# Patient Record
Sex: Male | Born: 1945 | Race: White | Hispanic: No | Marital: Married | Smoking: Former smoker
Health system: Southern US, Community
[De-identification: ages and names within clinical notes are randomized; demographics above are authoritative.]

## PROBLEM LIST (undated history)

## (undated) DIAGNOSIS — C4490 Unspecified malignant neoplasm of skin, unspecified: Secondary | ICD-10-CM

## (undated) DIAGNOSIS — D126 Benign neoplasm of colon, unspecified: Secondary | ICD-10-CM

## (undated) DIAGNOSIS — M1990 Unspecified osteoarthritis, unspecified site: Secondary | ICD-10-CM

## (undated) DIAGNOSIS — N400 Enlarged prostate without lower urinary tract symptoms: Secondary | ICD-10-CM

## (undated) DIAGNOSIS — E785 Hyperlipidemia, unspecified: Secondary | ICD-10-CM

## (undated) DIAGNOSIS — I4891 Unspecified atrial fibrillation: Secondary | ICD-10-CM

## (undated) DIAGNOSIS — I2510 Atherosclerotic heart disease of native coronary artery without angina pectoris: Secondary | ICD-10-CM

## (undated) DIAGNOSIS — N289 Disorder of kidney and ureter, unspecified: Secondary | ICD-10-CM

## (undated) DIAGNOSIS — N529 Male erectile dysfunction, unspecified: Secondary | ICD-10-CM

## (undated) DIAGNOSIS — K5790 Diverticulosis of intestine, part unspecified, without perforation or abscess without bleeding: Secondary | ICD-10-CM

## (undated) DIAGNOSIS — I499 Cardiac arrhythmia, unspecified: Secondary | ICD-10-CM

## (undated) DIAGNOSIS — G4733 Obstructive sleep apnea (adult) (pediatric): Secondary | ICD-10-CM

## (undated) DIAGNOSIS — K219 Gastro-esophageal reflux disease without esophagitis: Secondary | ICD-10-CM

## (undated) DIAGNOSIS — M5126 Other intervertebral disc displacement, lumbar region: Secondary | ICD-10-CM

## (undated) DIAGNOSIS — R202 Paresthesia of skin: Secondary | ICD-10-CM

## (undated) DIAGNOSIS — E039 Hypothyroidism, unspecified: Secondary | ICD-10-CM

## (undated) DIAGNOSIS — R310 Gross hematuria: Secondary | ICD-10-CM

## (undated) DIAGNOSIS — I10 Essential (primary) hypertension: Secondary | ICD-10-CM

## (undated) DIAGNOSIS — G4730 Sleep apnea, unspecified: Secondary | ICD-10-CM

## (undated) DIAGNOSIS — E079 Disorder of thyroid, unspecified: Secondary | ICD-10-CM

## (undated) DIAGNOSIS — Z87442 Personal history of urinary calculi: Secondary | ICD-10-CM

## (undated) DIAGNOSIS — G629 Polyneuropathy, unspecified: Secondary | ICD-10-CM

## (undated) DIAGNOSIS — N1330 Unspecified hydronephrosis: Secondary | ICD-10-CM

## (undated) DIAGNOSIS — E663 Overweight: Secondary | ICD-10-CM

## (undated) DIAGNOSIS — N200 Calculus of kidney: Secondary | ICD-10-CM

## (undated) DIAGNOSIS — E119 Type 2 diabetes mellitus without complications: Secondary | ICD-10-CM

## (undated) HISTORY — PX: NASAL SINUS SURGERY: SHX719

## (undated) HISTORY — DX: Gastro-esophageal reflux disease without esophagitis: K21.9

## (undated) HISTORY — PX: TONSILLECTOMY: SUR1361

## (undated) HISTORY — DX: Male erectile dysfunction, unspecified: N52.9

## (undated) HISTORY — PX: CARDIAC CATHETERIZATION: SHX172

## (undated) HISTORY — DX: Obstructive sleep apnea (adult) (pediatric): G47.33

## (undated) HISTORY — DX: Disorder of kidney and ureter, unspecified: N28.9

## (undated) HISTORY — DX: Hyperlipidemia, unspecified: E78.5

## (undated) HISTORY — PX: OTHER SURGICAL HISTORY: SHX169

## (undated) HISTORY — DX: Paresthesia of skin: R20.2

## (undated) HISTORY — DX: Unspecified atrial fibrillation: I48.91

## (undated) HISTORY — DX: Overweight: E66.3

## (undated) HISTORY — DX: Atherosclerotic heart disease of native coronary artery without angina pectoris: I25.10

## (undated) HISTORY — DX: Unspecified malignant neoplasm of skin, unspecified: C44.90

## (undated) HISTORY — DX: Calculus of kidney: N20.0

## (undated) HISTORY — DX: Unspecified hydronephrosis: N13.30

## (undated) HISTORY — DX: Disorder of thyroid, unspecified: E07.9

## (undated) HISTORY — DX: Essential (primary) hypertension: I10

## (undated) HISTORY — PX: EYE SURGERY: SHX253

## (undated) HISTORY — DX: Benign prostatic hyperplasia without lower urinary tract symptoms: N40.0

## (undated) HISTORY — PX: HERNIA REPAIR: SHX51

## (undated) HISTORY — PX: CATARACT EXTRACTION: SUR2

## (undated) HISTORY — DX: Type 2 diabetes mellitus without complications: E11.9

## (undated) HISTORY — DX: Sleep apnea, unspecified: G47.30

## (undated) HISTORY — DX: Polyneuropathy, unspecified: G62.9

## (undated) HISTORY — DX: Gross hematuria: R31.0

---

## 2003-07-02 ENCOUNTER — Other Ambulatory Visit: Payer: Self-pay

## 2005-06-10 ENCOUNTER — Ambulatory Visit: Payer: Self-pay | Admitting: Internal Medicine

## 2008-01-05 ENCOUNTER — Ambulatory Visit: Payer: Self-pay | Admitting: Internal Medicine

## 2008-01-12 ENCOUNTER — Ambulatory Visit: Payer: Self-pay | Admitting: Gastroenterology

## 2008-07-16 ENCOUNTER — Emergency Department: Payer: Self-pay | Admitting: Emergency Medicine

## 2008-07-18 ENCOUNTER — Ambulatory Visit: Payer: Self-pay | Admitting: Urology

## 2008-07-22 ENCOUNTER — Ambulatory Visit: Payer: Self-pay | Admitting: Urology

## 2008-07-29 ENCOUNTER — Ambulatory Visit: Payer: Self-pay | Admitting: Urology

## 2008-09-02 ENCOUNTER — Ambulatory Visit: Payer: Self-pay | Admitting: Urology

## 2008-09-16 ENCOUNTER — Ambulatory Visit: Payer: Self-pay | Admitting: Urology

## 2009-10-07 ENCOUNTER — Emergency Department: Payer: Self-pay | Admitting: Emergency Medicine

## 2009-10-31 ENCOUNTER — Ambulatory Visit: Payer: Self-pay | Admitting: Urology

## 2009-11-07 ENCOUNTER — Ambulatory Visit: Payer: Self-pay | Admitting: Urology

## 2009-11-17 ENCOUNTER — Ambulatory Visit: Payer: Self-pay | Admitting: Urology

## 2010-02-18 ENCOUNTER — Ambulatory Visit: Payer: Self-pay | Admitting: Internal Medicine

## 2010-08-18 ENCOUNTER — Inpatient Hospital Stay: Payer: Self-pay | Admitting: Internal Medicine

## 2011-04-19 ENCOUNTER — Ambulatory Visit: Payer: Self-pay | Admitting: Surgery

## 2011-06-27 ENCOUNTER — Ambulatory Visit: Payer: Self-pay | Admitting: Internal Medicine

## 2012-02-08 ENCOUNTER — Ambulatory Visit: Payer: Self-pay | Admitting: Internal Medicine

## 2012-02-08 LAB — BASIC METABOLIC PANEL
EGFR (African American): >60
EGFR (Non-African Amer.): >60
Osmolality: 288 (ref 275–301)
Potassium: 3.8 mmol/L (ref 3.5–5.1)
Sodium: 143 mmol/L (ref 136–145)

## 2012-02-08 LAB — URINALYSIS, COMPLETE
Bacteria: NEGATIVE
Bilirubin,UR: NEGATIVE
Glucose,UR: 100 mg/dL (ref 0–75)
Nitrite: NEGATIVE
Ph: 6 (ref 4.5–8.0)
Protein: NEGATIVE
Squamous Epithelial: NONE SEEN

## 2012-02-09 ENCOUNTER — Ambulatory Visit: Payer: Self-pay | Admitting: Internal Medicine

## 2012-02-09 LAB — URINE CULTURE

## 2012-05-14 ENCOUNTER — Observation Stay: Payer: Self-pay | Admitting: Internal Medicine

## 2012-05-14 LAB — CBC WITH DIFFERENTIAL/PLATELET
Basophil %: 1.6 %
Eosinophil #: 0.1 10*3/uL (ref 0.0–0.7)
HGB: 15.8 g/dL (ref 13.0–18.0)
Lymphocyte #: 1.8 10*3/uL (ref 1.0–3.6)
MCH: 30.8 pg (ref 26.0–34.0)
MCHC: 33.5 g/dL (ref 32.0–36.0)
MCV: 92 fL (ref 80–100)
Monocyte #: 0.4 x10 3/mm (ref 0.2–1.0)
Neutrophil #: 4.7 10*3/uL (ref 1.4–6.5)
Neutrophil %: 65.5 %
RBC: 5.13 10*6/uL (ref 4.40–5.90)

## 2012-05-14 LAB — COMPREHENSIVE METABOLIC PANEL
Alkaline Phosphatase: 78 U/L (ref 50–136)
Bilirubin,Total: 1.4 mg/dL — ABNORMAL HIGH (ref 0.2–1.0)
Calcium, Total: 8.9 mg/dL (ref 8.5–10.1)
Chloride: 108 mmol/L — ABNORMAL HIGH (ref 98–107)
Co2: 24 mmol/L (ref 21–32)
EGFR (African American): >60
EGFR (Non-African Amer.): >60
Glucose: 211 mg/dL — ABNORMAL HIGH (ref 65–99)
Osmolality: 286 (ref 275–301)
SGPT (ALT): 23 U/L (ref 12–78)

## 2012-05-14 LAB — TROPONIN I: Troponin-I: <0.02 ng/mL

## 2012-05-14 LAB — URINALYSIS, COMPLETE
Blood: NEGATIVE
Hyaline Cast: 3
Nitrite: NEGATIVE
Ph: 6 (ref 4.5–8.0)
Protein: NEGATIVE
RBC,UR: 1 /HPF (ref 0–5)
Specific Gravity: 1.017 (ref 1.003–1.030)
WBC UR: 1 /HPF (ref 0–5)

## 2012-05-15 LAB — BASIC METABOLIC PANEL
Calcium, Total: 8.7 mg/dL (ref 8.5–10.1)
Chloride: 108 mmol/L — ABNORMAL HIGH (ref 98–107)
Co2: 27 mmol/L (ref 21–32)
Creatinine: 1.03 mg/dL (ref 0.60–1.30)
EGFR (Non-African Amer.): >60
Potassium: 3.2 mmol/L — ABNORMAL LOW (ref 3.5–5.1)
Sodium: 143 mmol/L (ref 136–145)

## 2012-05-15 LAB — TROPONIN I: Troponin-I: <0.02 ng/mL

## 2012-05-15 LAB — LIPID PANEL
HDL Cholesterol: 42 mg/dL (ref 40–60)
Ldl Cholesterol, Calc: 54 mg/dL (ref 0–100)
Triglycerides: 117 mg/dL (ref 0–200)

## 2012-05-15 LAB — HEMOGLOBIN A1C: Hemoglobin A1C: 6.9 % — ABNORMAL HIGH (ref 4.2–6.3)

## 2012-10-24 ENCOUNTER — Ambulatory Visit: Payer: Self-pay | Admitting: Otolaryngology

## 2012-11-01 DIAGNOSIS — C4491 Basal cell carcinoma of skin, unspecified: Secondary | ICD-10-CM

## 2012-11-01 HISTORY — DX: Basal cell carcinoma of skin, unspecified: C44.91

## 2012-11-02 ENCOUNTER — Ambulatory Visit: Payer: Self-pay | Admitting: Otolaryngology

## 2012-12-12 ENCOUNTER — Ambulatory Visit: Payer: Self-pay | Admitting: Internal Medicine

## 2013-01-02 ENCOUNTER — Ambulatory Visit: Payer: Self-pay | Admitting: Internal Medicine

## 2013-04-11 ENCOUNTER — Ambulatory Visit: Payer: Self-pay | Admitting: Gastroenterology

## 2013-10-10 DIAGNOSIS — E039 Hypothyroidism, unspecified: Secondary | ICD-10-CM | POA: Insufficient documentation

## 2014-01-10 DIAGNOSIS — G4733 Obstructive sleep apnea (adult) (pediatric): Secondary | ICD-10-CM | POA: Insufficient documentation

## 2014-04-08 ENCOUNTER — Ambulatory Visit: Payer: Self-pay | Admitting: Urology

## 2014-05-16 DIAGNOSIS — I480 Paroxysmal atrial fibrillation: Secondary | ICD-10-CM | POA: Insufficient documentation

## 2014-06-27 ENCOUNTER — Ambulatory Visit: Payer: Self-pay | Admitting: Urology

## 2014-06-30 LAB — COMPREHENSIVE METABOLIC PANEL
ANION GAP: 8 (ref 7–16)
Albumin: 3.9 g/dL (ref 3.4–5.0)
Alkaline Phosphatase: 71 U/L (ref 46–116)
BUN: 16 mg/dL (ref 7–18)
Bilirubin,Total: 1.5 mg/dL — ABNORMAL HIGH (ref 0.2–1.0)
Calcium, Total: 9.2 mg/dL (ref 8.5–10.1)
Chloride: 104 mmol/L (ref 98–107)
Co2: 26 mmol/L (ref 21–32)
Creatinine: 1.23 mg/dL (ref 0.60–1.30)
EGFR (African American): >60
EGFR (Non-African Amer.): >60
Glucose: 171 mg/dL — ABNORMAL HIGH (ref 65–99)
Osmolality: 281 (ref 275–301)
POTASSIUM: 3.1 mmol/L — AB (ref 3.5–5.1)
SGOT(AST): 21 U/L (ref 15–37)
SGPT (ALT): 19 U/L (ref 14–63)
SODIUM: 138 mmol/L (ref 136–145)
TOTAL PROTEIN: 7.9 g/dL (ref 6.4–8.2)

## 2014-06-30 LAB — CBC
HCT: 41.2 % (ref 40.0–52.0)
HGB: 13.6 g/dL (ref 13.0–18.0)
MCH: 29.6 pg (ref 26.0–34.0)
MCHC: 33.1 g/dL (ref 32.0–36.0)
MCV: 89 fL (ref 80–100)
PLATELETS: 216 10*3/uL (ref 150–440)
RBC: 4.6 10*6/uL (ref 4.40–5.90)
RDW: 14.3 % (ref 11.5–14.5)
WBC: 13.3 10*3/uL — ABNORMAL HIGH (ref 3.8–10.6)

## 2014-06-30 LAB — URINALYSIS, COMPLETE
BACTERIA: NONE SEEN
SQUAMOUS EPITHELIAL: NONE SEEN
Specific Gravity: 1.016 (ref 1.003–1.030)
WBC UR: 19 /HPF (ref 0–5)

## 2014-06-30 LAB — MAGNESIUM: Magnesium: 1.5 mg/dL — ABNORMAL LOW

## 2014-07-01 ENCOUNTER — Inpatient Hospital Stay: Payer: Self-pay | Admitting: Urology

## 2014-07-01 LAB — CBC WITH DIFFERENTIAL/PLATELET
Basophil #: 0.1 10*3/uL (ref 0.0–0.1)
Basophil %: 0.5 %
Eosinophil #: 0 10*3/uL (ref 0.0–0.7)
Eosinophil %: 0 %
HCT: 37.2 % — AB (ref 40.0–52.0)
HGB: 12.3 g/dL — ABNORMAL LOW (ref 13.0–18.0)
LYMPHS ABS: 1.6 10*3/uL (ref 1.0–3.6)
Lymphocyte %: 15.8 %
MCH: 29.6 pg (ref 26.0–34.0)
MCHC: 32.9 g/dL (ref 32.0–36.0)
MCV: 90 fL (ref 80–100)
MONO ABS: 0.8 x10 3/mm (ref 0.2–1.0)
MONOS PCT: 8.1 %
NEUTROS ABS: 7.6 10*3/uL — AB (ref 1.4–6.5)
Neutrophil %: 75.6 %
PLATELETS: 196 10*3/uL (ref 150–440)
RBC: 4.15 10*6/uL — ABNORMAL LOW (ref 4.40–5.90)
RDW: 14.5 % (ref 11.5–14.5)
WBC: 10.1 10*3/uL (ref 3.8–10.6)

## 2014-07-01 LAB — BASIC METABOLIC PANEL
Anion Gap: 6 — ABNORMAL LOW (ref 7–16)
BUN: 13 mg/dL (ref 7–18)
CALCIUM: 8.1 mg/dL — AB (ref 8.5–10.1)
CO2: 28 mmol/L (ref 21–32)
Chloride: 107 mmol/L (ref 98–107)
Creatinine: 1.04 mg/dL (ref 0.60–1.30)
EGFR (Non-African Amer.): >60
Glucose: 169 mg/dL — ABNORMAL HIGH (ref 65–99)
Osmolality: 285 (ref 275–301)
Potassium: 3.6 mmol/L (ref 3.5–5.1)
Sodium: 141 mmol/L (ref 136–145)

## 2014-07-02 LAB — URINE CULTURE

## 2014-07-05 ENCOUNTER — Ambulatory Visit: Payer: Self-pay | Admitting: Urology

## 2014-07-09 ENCOUNTER — Ambulatory Visit: Payer: Self-pay | Admitting: Urology

## 2014-07-10 ENCOUNTER — Ambulatory Visit: Payer: Self-pay | Admitting: Urology

## 2014-08-01 ENCOUNTER — Ambulatory Visit: Payer: Self-pay | Admitting: Urology

## 2014-08-21 ENCOUNTER — Ambulatory Visit: Payer: Self-pay | Admitting: Urology

## 2014-08-30 ENCOUNTER — Ambulatory Visit: Payer: Self-pay

## 2014-09-17 NOTE — Consult Note (Signed)
Brief Consult Note: Diagnosis: CP, atypical, neg trop, normal ECG, known insig CAD by cath.   Patient was seen by consultant.   Consult note dictated.   Comments: REC  Agree with current therapy, defer full dose anticoagulation, defer further cardiac diagnostics, if patient dose well overnight clinically may consider dc in am.  Electronic Signatures: Isaias Cowman (MD)  (Signed 15-Dec-13 15:52)  Authored: Brief Consult Note   Last Updated: 15-Dec-13 15:52 by Isaias Cowman (MD)

## 2014-09-17 NOTE — H&P (Signed)
PATIENT NAME:  Christopher [name], Christopher [name] MR#:  [id number] DATE OF BIRTH:  [birth year]  DATE OF ADMISSION:  05/14/2012  PRIMARY CARE PHYSICIAN: Dr. Felipa Furnace of [facility].  PRIMAREY CARDIOLOGIST: Dr. Nehemiah Massed.   CHIEF COMPLAINT: Chest tightness and discomfort.   HISTORY OF PRESENT ILLNESS: This is a 69 year old male with past medical history of hypertension, diabetes, coronary artery disease as diagnosed by cardiac cath 2 years ago with blockages in multiple arteries and advised him to go for medical management in regular follow-up, which he was following. Tuesday morning 5:30 a.m. he woke up with some discomfort in the chest, which is substernal and discomfort type, which was not going away. He checked his blood pressure, which was slightly on the lower side; he mentions 99/60s. His heart rate was ranging from 60 to 90 and his blood sugar was more than 100. He continued having this discomfort and around 9:00 a.m. while he tried to get his breakfast, he suddenly had excessive sweating and his wife suggested to him to go to the Emergency Room rather than waiting, so he decided to come over here. ER physician evaluated him. He has a strong cardiac history, though he has troponin and EKG negative, but because of cardiac history and blockages, he is likely to have coronary artery disease at this time and that is why they suggested to admit him for observation.  REVIEW OF SYSTEMS:  CONSTITUTIONAL: He denies any fever, fatigue, weight loss, or any similar chest pain in the past and he says that he was able to walk or work without any chest pain or discomfort until now.   HEAD AND NECK: Atraumatic. Denies any headache, hearing problems or discharge from the ears. EYES: Denies any burning or discharge from the eyes or any redness.  RESPIRATORY: Denies any shortness of breath.  CARDIOVASCULAR: Denies any palpitation. Chest pain as described above. Denies any edema or swelling of the legs or any syncopal  episode.  GASTROINTESTINAL: Denies any nausea, vomiting, diarrhea or abdominal pain.  GENITOURINARY: Denies any increased frequency of urination or burning in the urination.  MUSCULOSKELETAL: Denies any joint swelling, tenderness or pain.  NEUROLOGICAL: Denies any weakness, numbness, tingling or tremors.  PSYCHIATRIC: Denies any insomnia, depression or anxiety episodes.   PAST MEDICAL HISTORY: Positive for coronary artery disease, hypertension, diabetes, benign prostatic hypertrophy and hyperlipidemia.   PAST SURGICAL HISTORY: Positive for hernia surgery and tonsillectomy.   MEDICATIONS AT HOME: Levothyroxine 100 mcg daily, amlodipine and benazepril 5/10 mg daily, finasteride 5 mg daily, metformin 1000 mg 2 times a day, glipizide 10 mg daily, pantoprazole twice a day, tamsulosin 0.4 mg tablet, 2 tablets at bedtime, atorvastatin 20 mg at bedtime, doxazosin 4 mg at bedtime, Januvia 100 mg daily, bisoprolol and hydrochlorothiazide 5 mg plus 6.25 mg daily and aspirin 82 mg daily.   SOCIAL HISTORY: No smoking, occasional alcohol and no IV drug use. He is a retired Teaching laboratory technician person.   FAMILY HISTORY: Father with congestive heart failure and liver cancer. Mother had stroke and heart failure.   PHYSICAL EXAMINATION:  VITAL SIGNS: Temperature 97.9, pulse rate 71, respirations 16, blood pressure 122/75, oxygen saturation 98 on room air.  GENERAL: He is fully alert, oriented to time, place, and person. Not in any acute distress. He is cooperative with history taking and physical examination.  HEAD AND NECK: Atraumatic. Conjunctivae pink. Oral mucosa moist. Hearing grossly intact.  NECK: Supple. No mass. No JVD.  RESPIRATORY: Bilateral clear and equal air entry. No crackles or  rhonchi heard.  CARDIOVASCULAR: S1, S2 present, regular. No murmur appreciated. No local tenderness on chest.  ABDOMEN: Soft, nontender. Bowel sounds present. No organomegaly appreciated.  SKIN: No rashes.  EXTREMITIES: No edema  on the legs. Joints: Nontender. No swelling.  NEUROLOGICAL: Grossly intact. Power 5 out of 5 in all 4 limbs. Sensation grossly normal. No tremors. No rigidity.  PSYCHIATRIC: Does not appear in any acute psychiatric problem at this point of time.   LABORATORIES: Glucose 211, BUN 15, creatinine 0.91, sodium 140, potassium 3.5, chloride 108, CO2 24. Calcium 8.9, total protein 7.3, albumin 4.0, bilirubin 1.4, alkaline phosphatase 78, SGOT 18,  SGPT 23. Troponin less than 0.02. WBC 7.2, hemoglobin 15.8, platelets 203, MCV 92, APTT 28.1. Urinalysis grossly negative. EKG normal sinus rhythm. Chest x-ray, no acute cardiopulmonary disease evident.   ASSESSMENT AND PLAN: A 69 year old male with a history of blockages in the coronary artery as evident by cardiac catheterization in the past and was advised to go for medical management. He came with chest tightness. 1.  Chest pain, rule out coronary artery disease: Will keep him under observation in telemetry, do  his troponin every 8 hours for 3 times, continue all cardiac medications as he is taking at home and will do stress test in the morning. He is following with Dr. Nehemiah Massed in cardiology, so called Dr. ______.  I spoke to him for cardiology consult and if any further workup needed.  2.  Hypertension: We will continue his home medication of amlodipine, benazepril, bisoprolol and hydrochlorothiazide.  3.  Diabetes: We will continue his home medication of metformin, glipizide and Januvia.  4.  Hyperlipidemia: Will continue his home medication of atorvastatin.  5.  Benign prostatic hypertrophy: We will continue his home medication doxazosin and tamsulosin.  6.  Hypothyroidism: We will continue home medication of levothyroxine 100 mcg.  7.  History of gastrointestinal bleed and for gastrointestinal prophylaxis: Will continue pantoprazole twice a day.  8.  CODE STATUS: Full code.   TOTAL TIME SPENT: 50 minutes in the admission.   I will endorse to  _[email or web address]____ Clinic on-call doctor in the afternoon.   ____________________________ Ceasar Lund Anselm Jungling, MD vgv:aw D: 05/14/2012 12:29:00 ET T: 05/15/2012 06:06:34 ET JOB#: [id number]  cc: Ceasar Lund. Anselm Jungling, MD, <Dictator> Vaughan Basta MD ELECTRONICALLY SIGNED 05/15/2012 23:01

## 2014-09-20 NOTE — Op Note (Signed)
PATIENT NAME:  Christopher [name], Christopher [name] MR#:  [id number] DATE OF BIRTH:  April 02, 1946  DATE OF OPERATION:  11/02/2012  SURGEON:  Janalee Dane, MD.  PREOPERATIVE DIAGNOSIS: Nasal obstruction, secondary to septal deformity and bilateral inferior turbinate hypertrophy.   POSTOPERATIVE DIAGNOSIS: Nasal obstruction, secondary to septal deformity and bilateral inferior turbinate hypertrophy.   PROCEDURES: 1.  (Revision) septoplasty.  2.  Bilateral submucous resection of the inferior turbinate.   ANESTHESIA:  General  FINDINGS:  The septal mucoperichondrium and mucoperiosteum were very scarred together, consistent with the prior septoplasty. The mucosa was very thin at the junction between the residual, severely right-sided vomerine bone and residual perpendicular plate of the ethmoid. There was a large maxillary crest deviation to the right. The inferior turbinates were severely hypertrophied.  DESCRIPTION OF PROCEDURE:  The patient was identified in the holding area and was brought back to the operating room in the supine position on the operating room table.  After general endotracheal anesthesia had been induced the patient was turned 90 degrees counter clockwise from anesthesia.  The nose was anesthetized with infraorbital nerve blocks and septal injection with 0.5% Lidocaine and 0.25% Bupivacaine mixed with 1:150,000 with Epinephrine and phenylephrine Lidocaine soaked pledgets, two on each side were placed and the face was prepped and draped in the usual fashion.  The pledgets were removed.  A 15 blade was used to make a left-sided hemitransfixion incision and septal mucoperichondrial mucoperiosteal leaflets elevated.  There was a large inferior spur that was resected with Jansen-Middleton forceps.  The remaining septum was deviated back and forth in an accordion like fashion.  The bony cartilaginous junction was then divided and a moderate amount of vomer and perpendicular plate was taken down with  Jansen-Middleton forceps, releasing the tension on the remaining septum.  The septum then swung back into the midline.  The septal leaflets were closed with quilting 4-0 chromic suture.  The left sided hemitransfixion incision was closed with 4-0 plain gut.  Attention was directed to the turbinates which had been previously injected on the left.  The head of the inferior turbinate on the left was incised with a 15 blade and the medial mucoperiosteum was elevated using a caudal elevator.  Once this had been elevated Knight scissors were used to resect the conchal bone and lateral mucoperiosteum.  The inferior margin of the remaining mucoperiosteum was then cauterized with suction cautery and Surgiflo was placed at the inferior to the inferior margin of the remaining inferior turbinate.  An identical procedure was performed on the right inferior turbinate with once again placement of Surgiflo along its inferior margin.  Temporary Telfa pledgets were then placed.  The patient was allowed to emerge from anesthesia, extubated in the operating room and taken to the recovery room in stable condition.  There were no complications.  Estimated blood loss was less than 10 milliliters.  FINDINGS: The septal mucoperichondrium been repaired mucoperiosteum was very scarred together, consistent with the prior septoplasty. The mucosa was very and at the junction between residual severely right-sided vomer ring bone residual perpendicular plate of the ethmoid. There was a large maxillary crest deviation to the right the inferior turbinates were severely hypertrophied total amount of Surgiflo was 1 unit.   There were no complications.    ____________________________ Lenna Sciara. Nadeen Landau, MD jmc:dm D: 11/02/2012 11:11:28 ET T: 11/02/2012 11:18:06 ET JOB#: [id number]  cc: Janalee Dane, MD, <Dictator> Nicholos Johns MD ELECTRONICALLY SIGNED 11/22/2012 7:47

## 2014-09-20 NOTE — Consult Note (Signed)
PATIENT NAME:  Christopher [name], Christopher [name] MR#:  [id number] DATE OF BIRTH:  01-Jul-1945  DATE OF CONSULTATION:  05/14/2012  PRIMARY CARE PHYSICIAN: Dr. Doy Hutching.  REFERRING PHYSICIAN:   CONSULTING PHYSICIAN:  Isaias Cowman, MD  CHIEF COMPLAINT: " My wife made me come.".   REASON FOR CONSULTATION: Consultation requested for evaluation of chest discomfort.   HISTORY OF PRESENT ILLNESS: The patient is a 69 year old gentleman with known insignificant coronary artery disease, hypertension and diabetes. He reports that he was in his usual state of health until last evening when he noted palpitations. The patient has had a history of palpitations. He denies chest pain or shortness of breath. He presented to [facility] Emergency Room where EKG was nondiagnostic. Troponin is negative. The patient denies chest pain.   PAST MEDICAL HISTORY:  1.  Hypertension.  2.  Diabetes. 3.  Palpitations.  4.  Hypothyroidism.   MEDICATIONS ON ADMISSION: Aspirin 81 mg daily, amlodipine/benazepril 5/10, 1 daily, atorvastatin 20 mg daily, doxazosin 4 mg daily, bisoprolol/hydrochlorothiazide 5/6.25 mg daily, finasteride 5 mg daily, Flomax 0.4 mg at bedtime, glipizide 20 mg daily, Januvia 100 mg daily, levothyroxine 100 mcg daily, metformin 1000 mg b.i.d. and pantoprazole 40 mg b.i.d.   SOCIAL HISTORY: He is married. He resides with his wife. He denies tobacco abuse. He occasionally drinks alcohol.   FAMILY HISTORY: No immediate family history of coronary disease or myocardial infarction.   REVIEW OF SYSTEMS:   CONSTITUTIONAL: No fever or chills.  EYES: No blurry vision.  EARS: No hearing loss.  RESPIRATORY: No shortness of breath.  CARDIOVASCULAR: Palpitations as described above.  GASTROINTESTINAL: No nausea, vomiting, or diarrhea.  GASTROINTESTINAL: No dysuria or hematuria.  ENDOCRINE: No polyuria or polydipsia.  MUSCULOSKELETAL: No arthralgias or myalgias.  NEUROLOGICAL: No focal weakness or numbness.  PSYCHOLOGICAL: No  depression or anxiety.   PHYSICAL EXAMINATION:  VITAL SIGNS: Blood pressure 129/81, pulse 70, respirations 20, temperature 98, pulse ox 95%.  HEENT: Pupils equal, reactive to light and accommodation.  NECK: Supple without thyromegaly.  LUNGS: Clear.  HEART: Normal JVP. Normal PMI. Regular rate and rhythm. Normal S1, S2. No appreciable gallop, murmur, or rub.  ABDOMEN: Soft and nontender. Pulses were intact bilaterally.  MUSCULOSKELETAL: Normal muscle tone.  NEUROLOGIC: The patient is alert and oriented x3. Motor and sensory both grossly intact.   ACCESSORY DATA: EKG reveals sinus rhythm. Normal ECG.   IMPRESSION: A 69 year old gentleman with known insignificant coronary artery disease, who presents with chest pain with atypical features, currently without chest pain. EKG is normal. Troponin is negative.   RECOMMENDATIONS:  1.  I agree with current therapy.  2.  Would defer full dose anticoagulation.  3.  In light of absence of chest pain, normal ECG, normal troponin and recent insignificant coronary artery disease by cardiac catheterization, would not recommend noninvasive or invasive cardiac evaluation at this time.  4.  Continue to monitor on telemetry. If the patient does well clinically, may consider discharge home in a.m.    ____________________________ Isaias Cowman, MD ap:aw D: 05/14/2012 15:51:21 ET T: 05/15/2012 11:07:11 ET JOB#: [id number]  cc: Isaias Cowman, MD, <Dictator> Isaias Cowman MD ELECTRONICALLY SIGNED 05/31/2012 11:33

## 2014-09-25 ENCOUNTER — Other Ambulatory Visit: Payer: Self-pay | Admitting: Obstetrics and Gynecology

## 2014-09-25 DIAGNOSIS — N1330 Unspecified hydronephrosis: Secondary | ICD-10-CM

## 2014-09-29 NOTE — Op Note (Signed)
Patient: This 69 year old Male had a surgical procedure performed on 01-Jul-2014.  Post Operative Report:  Pre-Op Diagnosis Obstructing right ureteral calculus   Post-Op Diagnosis Same   Operation Cystoscopy, right retrograde pyelogram, right ureteral stent placement   Anesthesia General   Specimen Type Describe  Urine culture   Findings Right hydronephrosis; obstructing right proximal ureteral stone 37mm; successful placement of 26cm x 4.37F right ureter stent   Surgeon Chales Salmon, MD   Assistant none   EBL: Minimal   Complications None   Description of Procedure: INDICATIONS FOR PROCEDURE:  Mr. Moone is a 69 year-old man on eliquis with a 36mm right proximal ureteral steinstrasse collection of stones 4 days post ESWL for a large right renal calculus.  He has unrelenting right flank pain, nausea, vomiting, perinephric stranding and leukocytosis. I discussed the risks, benefits and alternatives of ureteral stent placement with him including risk of damage to the ureter, kidney or bladder and urethra, worsening infection, bleeding, ureteral stricture or loss of kidney.  He elected to proceed with right ureteral stent placement.  DESCRIPTION OF PROCEDURE IN DETAIL:  After informed consent, Mr. Szabo was brought to the operating suite and placed in a supine position for administration of general anesthesia.  He was then repositioned in a low dorsal lithotomy and his lower abdomen, perineum and genitalia were then prepped and draped in the usual sterile fashion.  Perioperative IV ceftriaxone was given.    A 21 French rigid cystoscope was inserted per urethra. Cystoscopy showed some prostatic enlargement, a normal bladder  and normal orthotopic ureteral orifices.    The right ureteral orifice had persistent bloody efflux.  It was intubated with a 5 [place] open-ended catheter.  A  sensor wire was passed through the 5 French catheter into the distal ureter and the 5 French catheter was  advanced.  A gentle retrograde pyelogram was performed showing a normal caliber ureter up to the proximal ureter where there was a 59mm stone. Hydronephrosis was seen proximal to the stone.  The sensor wire was then advanced into the right renal pelvis.   Debris and blood was then seen eminating from the ureteral orifice.  An attempt to place a 26 cm x 6 [employer] Percuflex ureteral stent was unsuccessful as it could not be passed beyond the proximal stone.  A 26cm x 4.37F stent was then successfully passed under direct cystoscopic and fluoroscopic guidance over the sensor wire.  It was deployed with a 360 degree coil seen in both the upper pole of the right kidney and the bladder.  The stent continued to drain bloody urine. The cystoscope was removed and a new 74 [place] Foley catheter was inserted per urethra and the balloon was inflated with 15cc and the catheter was allowed to gravity drainage.  Urine was collected from the catheter and sent as a culture specimen.  This concluded the case.  The patient was awoken from anesthesia, extubated and brought to the PACU in stable condition.  He will be admitted to the medical service for observation. He will need definitive stone management in 1-2 weeks after acute infection has resolved.   Electronic Signatures: Prentiss Bells (MD)  (Signed 01-Feb-16 00:08)  Authored: Patient and Date/Time, Operative Note   Last Updated: 01-Feb-16 00:08 by Prentiss Bells (MD)

## 2014-09-29 NOTE — H&P (Signed)
PATIENT NAME:  Christopher [name], Christopher [name] MR#:  [id number] DATE OF BIRTH:  1946/02/15  DATE OF ADMISSION:  06/30/2014  PRIMARY CARE PHYSICIAN: Leonie Douglas. Doy Hutching, MD   REFERRING PHYSICIAN: Debbrah Alar, MD   CHIEF COMPLAINT: Right flank pain today with hematuria.   HISTORY OF PRESENT ILLNESS: A 69 year old Caucasian male with a history of hypertension, diabetes, CAD, atrial fibrillation, presented to the ED with the above chief complaint. The patient is alert, awake, oriented, in no acute distress. The patient said that he was diagnosed with a kidney stone, got a lithotripsy 4 days ago. He was fine until today. He started to have right flank pain with hematuria. The patient denies any fever or chills, denies any dysuria. No nausea or vomiting. The he patient said that he had chronic diarrhea.   The patient has a history of atrial fibrillation. He is on Eliquis. He stopped Eliquis 5 days before lithotripsy, restarted 1 day after lithotripsy, but since he had hematuria after lithotripsy he stopped Eliquis 1 dose and then restarted 2 days after lithotripsy. The patient also took 1 dose of Eliquis today.   PAST MEDICAL HISTORY: Hypertension, diabetes, CAD, atrial fibrillation, BPH, hyperlipidemia.   PAST SURGICAL HISTORY: Hernia repair and tonsillectomy.   FAMILY HISTORY: Father had heart failure and liver cancer. Mother had a stroke and heart failure.   ALLERGIES: ETODOLAC AND PENICILLIN.   HOME MEDICATIONS:  1.  Flomax 0.4 mg 2 tablets once a day at bedtime. 2.  Pioglitazone 30 mg p.o. daily. 3.  Pantoprazole 40 mg p.o. b.i.d.  4.  Lopressor 25 mg 0.5 tablets once a day. 5.  Metformin 1000 mg p.o. b.i.d. 6.  Lisinopril 20 mg p.o. b.i.d.  7.  Levothyroxine 100 mcg p.o. daily 8.  Glypizide 10 mg p.o. b.i.d.  9.  Flonase 50 mcg 2 sprays nasal once a day at bedtime.  10.  Flecainide 50 mg p.o. every 12 hours.  11.  Finasteride 5 mg p.o. daily.  12.  Eliquis 5 mg p.o. b.i.d.  13.  Doxazosin 4 mg  p.o. at bedtime. 14.  Atorvastatin 20 mg p.o. at bedtime. 15.  Norvasc 5 mg p.o. daily. 16.  Acetaminophen/oxycodone 325 mg/5 mg p.o. tablets 1 tablet every 6 hours p.r.n.   REVIEW OF SYSTEMS: CONSTITUTIONAL: The patient denies any fever or chills. No headache or dizziness. No weakness.  EYES: No double vision. No blurry vision.  EARS, NOSE, AND THROAT: No postnasal drip, slurred speech, or dysphagia.  CARDIOVASCULAR: No chest pain, palpitation, orthopnea, or nocturnal dyspnea. No leg edema.  PULMONARY: No cough, sputum, shortness of breath, or hemoptysis.  GASTROINTESTINAL: No abdominal pain, nausea, vomiting, but has chronic diarrhea. Has right flank pain.  GENITOURINARY: No dysuria, but has hematuria. No incontinence. Has right flank pain.  HEMATOLOGY: No easy bleeding or bleeding. ENDOCRINOLOGY: No polyuria or polydipsia, heat or cold intolerance.  NEUROLOGY: No syncope, loss of consciousness, or seizure.   PHYSICAL EXAMINATION:  VITAL SIGNS: Temperature 99.1, blood pressure 159/86, pulse 88, oxygen saturation 98% on room air.  GENERAL: The patient is alert, awake, oriented, in no acute distress.  HEENT: Pupils round, equal, reactive to light and accommodation. Moist oral mucosa. Clear oropharynx.  NECK: Supple. No JVD or carotid bruit. No lymphadenopathy. No thyromegaly.  CARDIOVASCULAR: S1 and S2, regular rate and rhythm. No murmurs or gallops.  PULMONARY: Bilateral air entry. No wheezing or rales. No use of accessory muscles to breathe.  ABDOMEN: Soft. No distention. No organomegaly. Bowel sounds present.  Right-sided flank pain and tenderness.  EXTREMITIES: No edema, clubbing, or cyanosis. No calf tenderness. Bilateral pedal pulses present.  SKIN: No rash or jaundice.  NEUROLOGIC: A and O x 3. No focal deficit. Power 5/5. Sensation intact.   LABORATORY DATA: CAT scan of the abdomen and pelvis showed obstructing stone versus a linear stone fragment in the right proximal ureter  spanning 13 mm with mild resultant right hydronephrosis and perinephritic stranding, colon diverticulosis without diverticulitis. Also, additional nonobstructing bilateral renal stones.   Urinalysis showed wbc's 19, rbc's 3000, nitrites negative. CBC showed WBC 13.3, hemoglobin 13.6, platelets 216,000. Glucose 171, BUN 16, creatinine 1.23. Electrolytes are normal except potassium 3.1.   IMPRESSIONS:  1.  Obstructive nephrolithiasis with hydronephrosis.  2.  Hematuria.  3.  Hypertension. 4.  Diabetes.  5.  Coronary artery disease.  6.  History of atrial fibrillation.  7.  Benign prostatic hypertrophy.  8.  Hypokalemia.  9.  Urinary tract infection.   PLAN OF TREATMENT:  1.  The patient will be admitted to medical floor. According to ED physician, Dr. Archie Balboa, Dr. Deatra Ina urologist will place a ureteral stent tonight. We will hold Eliquis and start Rocephin and follow up urine culture.  2.  For hypokalemia, we will give potassium supplement and follow up potassium level and magnesium level.  3.  For hypertension, continue the patient's hypertension medication.  4.  For diabetes, hold p.o. diabetes medication and start a sliding scale.   I discussed the patient's condition and plan of treatment with the patient and the patient's wife.   CODE STATUS: The patient wants full code.   TIME SPENT: About 57 minutes.    ____________________________ Demetrios Loll, MD qc:TT D: 06/30/2014 22:19:49 ET T: 06/30/2014 22:36:48 ET JOB#: [id number]  cc: Demetrios Loll, MD, <Dictator> Demetrios Loll MD ELECTRONICALLY SIGNED 07/01/2014 11:23

## 2014-09-29 NOTE — Consult Note (Signed)
Admit Reason:   Right ureteral stone (N20.1): Status: Active, Coding System: ICD10, Coded Name: Calculus of ureter    shingles:    diverticulitis with GI bleed:    palpitations:    thyroid:    BPH:    CAD:    gi bleed:    kidney stone:    Diabetes Mellitus, Type II (NIDD):    Hypertension:    Hypothyroidism:    lithotripsy:    umbilical hernia repair:    Tonsillectomy and Adenoidectomy:    Sinus Surgery:   Home Medications: Medication Instructions Status  acetaminophen-oxyCODONE 325 mg-5 mg oral tablet 1 tab(s) orally every 6 hours, As Needed - for Pain Active  atorvastatin 20 mg oral tablet 1 tab(s) orally once a day (at bedtime) Active  doxazosin 4 mg oral tablet 1 tab(s) orally once a day (at bedtime) Active  finasteride 5 mg oral tablet 1 tab(s) orally once a day (in the morning) Active  glipiZIDE 10 mg oral tablet 1 tab(s) orally 2 times a day Active  levothyroxine 100 mcg (0.1 mg) oral tablet 1 tab(s) orally once a day (in the morning) Active  metformin 1000 mg oral tablet 1 tab(s) orally 2 times a day Active  pantoprazole 40 mg oral delayed release tablet 1 tab(s) orally 2 times a day Active  amLODIPine 5 mg oral tablet 1 tab(s) orally once a day Active  lisinopril 20 mg oral tablet 1 tab(s) orally 2 times a day Active  Flonase 50 mcg/inh nasal spray 2 spray(s) nasal once a day (at bedtime) Active  metoprolol extended release 25 mg oral tablet, extended release 1/2  tab(s) orally once a day Active  flecainide 50 mg oral tablet 1 tab(s) orally every 12 hours Active  Eliquis 5 mg oral tablet 1 tab(s) orally 2 times a day Active  tamsulosin 0.4 mg oral capsule 2 cap(s) orally once a day (at bedtime) Active  pioglitazone 30 mg oral tablet 1 tab(s) orally once a day Active   Lab Results: Hepatic:  31-Jan-16 19:09   Bilirubin, Total  1.5  Alkaline Phosphatase 71  SGPT (ALT) 19  SGOT (AST) 21  Total Protein, Serum 7.9  Albumin, Serum 3.9  Routine  Chem:  31-Jan-16 19:09   Result Comment MACROSCOPIC URINALYSIS - Unable to obtain valid dipstick results  - due to interference of gross blood in the  - specimen.  Result(s) reported on 30 Jun 2014 at 07:30PM.  Glucose, Serum  171  BUN 16  Creatinine (comp) 1.23  Sodium, Serum 138  Potassium, Serum  3.1  Chloride, Serum 104  CO2, Serum 26  Calcium (Total), Serum 9.2  Osmolality (calc) 281  eGFR (African American) >60  eGFR (Non-African American) >60 (eGFR values <33m/min/1.73 m2 may be an indication of chronic kidney disease (CKD). Calculated eGFR, using the MRDR Study equation, is useful in  patients with stable renal function. The eGFR calculation will not be reliable in acutely ill patients when serum creatinine is changing rapidly. It is not useful in patients on dialysis. The eGFR calculation may not be applicable to patients at the low and high extremes of body sizes, pregnant women, and vegetarians.)  Anion Gap 8  Routine UA:  31-Jan-16 19:09   Color (UA) RED  Clarity (UA) CLOUDY  Glucose (UA) see comment  Bilirubin (UA) see comment  Ketones (UA) see comment  Specific Gravity (UA) 1.016  Blood (UA) see comment  pH (UA) see comment  Protein (UA) see comment  Nitrite (UA) SEE  COMMENT  Leukocyte Esterase (UA) see comment  Result(s) reported on 30 Jun 2014 at 07:30PM.  RBC (UA) 3000 /HPF  WBC (UA) 19 /HPF  Bacteria (UA) NONE SEEN  Epithelial Cells (UA) NONE SEEN  Routine Hem:  31-Jan-16 19:09   WBC (CBC)  13.3  RBC (CBC) 4.60  Hemoglobin (CBC) 13.6  Hematocrit (CBC) 41.2  Platelet Count (CBC) 216 (Result(s) reported on 30 Jun 2014 at 07:24PM.)  MCV 89  MCH 29.6  MCHC 33.1  RDW 14.3   Radiology Results:  Radiology Results: CT:    31-Jan-16 20:53, CT Abdomen Pelvis WO for Stone  CT Abdomen Pelvis WO for Stone  REASON FOR EXAM:    right flank pain, recent lithotripsy  COMMENTS:       PROCEDURE: CT  - CT ABDOMEN /PELVIS WO (STONE)  - Jun 30 2014  8:53PM      CLINICAL DATA:  Acute onset of right flank pain earlier today,  hematuria. Patient with lithotripsy 3 days prior for right renal  stone.    EXAM:  CT ABDOMEN AND PELVIS WITHOUT CONTRAST    TECHNIQUE:  Multidetector CT imaging of the abdomen and pelvis was performed  following the standard protocol without IV contrast.  COMPARISON:  CT 04/08/2014    FINDINGS:  Linear atelectasis in the left greater than right lower lobes. The  lung bases are otherwise clear.    Elongated stone versus stone fragments in the right proximal ureter  spanning 13 mm in cranial caudal dimension with resultant mild  hydroureteronephrosis. There is mild perinephric stranding. Ureter  distal to the stone is decompressed. There are additional  nonobstructing stones in the right kidney. Largest right renal stone  is in the lower pole measuring 10 mm. Small cyst on prior contrast  enhanced exam is not well seen.    Nonobstructing stone in the interpolar region of the left kidney  measures 9 mm. No left hydronephrosis. The left ureter is  decompressed. The small renal cysts on prior contrast-enhanced exam  are not well seen.    Urinary bladder is physiologically distended. No bladder wall  thickening. No bladder stone.    There is no focal hepatic lesion. The gallbladder is minimally  distended without calcified stone. The spleen and pancreas are  unremarkable. Mild thickening of both adrenal glands, stable from  prior exam. There are no dilated or thickened bowel loops. Multiple  colonic diverticula are again seen without diverticulitis. The  sigmoid colon is tortuous in its course. The appendix is normal.    Nofree air, free fluid, or intra-abdominal fluid collection. The  abdominal aorta is normal in caliber with moderate atherosclerosis.  There is no retroperitoneal adenopathy.    Within the pelvis the prostate gland is normal in size with central  prostatic calcification. There is fat in the  right greater than left  inguinal canal, similar to prior exam. No pelvic free fluid.  Previous free fluid in the pelvis has resolved.    There is degenerative change in the spine and both hips. No lytic or  blastic osseous lesion.     IMPRESSION:  1. Obstructing stone versus linear stone fragments in the right  proximal ureter spanning 13 mm with mild resultant right  hydroureteronephrosis and perinephric stranding.  2. Additional nonobstructing bilateral renal stones.  3. Colonic diverticulosis without diverticulitis.      Electronically Signed    By: Jeb Levering M.D.    On: 06/30/2014 21:19  Verified By: Rollene Fare Marisue Humble, M.D.,    Penicillin: Rash  Etodolac: Other   General Aspect This is a 69 year old man who underwent ESWL 3 days ago for a large right sided renal calculus by Dr. Erlene Quan [facility]).  He is here today with significant right sided flank pain since 2pm, described as stabbing, localized to the flank with associated nausea and vomiting. No fevers. He has hematuria but no signficant LUTS.   Case History and Physical Exam:  Chief Complaint Nausea/Vomiting  Right flank pain   Past Medical Health Coronary Artery Disease, Hypertension, Diabetes Mellitus, hypothyroid   Past Surgical History ESWL 3 days prior to admission   Primary Care Provider [facility]   Family History Non-Contributory   HEENT PERLA   Neck/Nodes Supple  No Adenopathy   Chest/Lungs Clear  no w/r/r   Cardiovascular No Murmurs or Gallops  Normal Sinus Rhythm   Abdomen mild R CVAT   Genitalia WNL   Rectal Not examined   Musculoskeletal Full range of motion   Neurological Grossly WNL   Skin Warm  Dry    Impression 69 year old man 3 days s/p ESWL for a large right renal calculus, now with a 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 He has obstructive symptoms, hydronephrosis and perinephric stranding with mild leukocytosis,  concerning for underlying infection.   Plan 1) Will proceed tonight with urgent cystoscopy, right retrograde phyelogram and right ureteral stent placement for decompression of the kidney. 2) Stent placement may help with stone passage, but we discussed that he may ultimately require further definitive ureteroscopy for stone clearance 3) Will admit post-op to medicine service 4) IV antibiotics 5) IV fluids 6) Pain control and antiemetics  Thank you for involving me in the care of Mr. HLave Please contact Urology with any further questions.   Electronic Signatures: KPrentiss Bells(MD)  (Signed 31-Jan-16 22:57)  Authored: Health Issues, Significant Events - History, Home Medications, Labs, Radiology Results, Allergies, General Aspect/Present Illness, History and Physical Exam, Impression/Plan   Last Updated: 31-Jan-16 22:57 by KPrentiss Bells(MD)

## 2014-09-29 NOTE — Consult Note (Signed)
Chief Complaint:  Subjective/Chief Complaint pod 1 s/p right ureteral stent. Pain, nausea improved. No fevers. Tol POs. Foley in place draining blood tinged urine.   VITAL SIGNS/ANCILLARY NOTES: **Vital Signs.:   01-Feb-16 03:05  Vital Signs Type Post-Op  Temperature Temperature (F) 97.6  Celsius 36.4  Temperature Source oral  Pulse Pulse 85  Respirations Respirations 18  Systolic BP Systolic BP 947  Diastolic BP (mmHg) Diastolic BP (mmHg) 78  Mean BP 95  Pulse Ox % Pulse Ox % 94  Pulse Ox Activity Level  At rest  Oxygen Delivery Non-invasive ventilation (CPAP/BIPAP)  *Intake and Output.:   Shift 01-Feb-16 07:00  Grand Totals Intake:  670 Output:  550    Net:  120 24 Hr.:  120  Oral Intake      In:  170  IV (Primary)      In:  450  IV (Secondary)      In:  50  Urine ml     Out:  550  Length of Stay Totals Intake:  670 Output:  550    Net:  120   Brief Assessment:  GEN well developed, well nourished   Cardiac Regular  no murmur  -- LE edema  --Rub  --Gallop   Respiratory normal resp effort  clear BS  no use of accessory muscles   Gastrointestinal Normal   Gastrointestinal details normal Soft  Nontender  Nondistended   EXTR negative cyanosis/clubbing, negative edema   Lab Results: Routine Hem:  01-Feb-16 06:44   WBC (CBC) 10.1  RBC (CBC)  4.15  Hemoglobin (CBC)  12.3  Hematocrit (CBC)  37.2  Platelet Count (CBC) 196  MCV 90  MCH 29.6  MCHC 32.9  RDW 14.5  Neutrophil % 75.6  Lymphocyte % 15.8  Monocyte % 8.1  Eosinophil % 0.0  Basophil % 0.5  Neutrophil #  7.6  Lymphocyte # 1.6  Monocyte # 0.8  Eosinophil # 0.0  Basophil # 0.1 (Result(s) reported on 01 Jul 2014 at 07:01AM.)   Assessment/Plan:  Assessment/Plan:  Assessment 48M pod 1 s/p right ureteral stent for steinstrasse post eswl. Doing well.   Plan 1) D/C Foley catheter 2) D/C home per primary team with outpatient follow-up with Dr. Erlene Quan, Urology 3) Cipro 500mg  BID x 5 days, Percocet  and Flomax 0.4mg   Thank you for involving me in the care of Mr. Boulay. Please call Urology with any further questions.   Electronic Signatures: Prentiss Bells (MD)  (Signed 01-Feb-16 07:10)  Authored: Chief Complaint, VITAL SIGNS/ANCILLARY NOTES, Brief Assessment, Lab Results, Assessment/Plan   Last Updated: 01-Feb-16 07:10 by Prentiss Bells (MD)

## 2014-09-29 NOTE — Op Note (Signed)
PATIENT NAME:  Christopher [name], BOS MR#:  [id number] DATE OF BIRTH:  [birth year]  DATE OF PROCEDURE:  07/10/2014  PREOPERATIVE DIAGNOSIS: Right ureteral and kidney stones.   POSTOPERATIVE DIAGNOSIS: Right ureteral and kidney stones.   PROCEDURE PERFORMED: Right ureteroscopy, laser lithotripsy, right ureteral stent exchange, basket extraction of stone.   ANESTHESIA: General anesthesia.   ESTIMATED BLOOD LOSS: Minimal.   ATTENDING SURGEON: Sherlynn Stalls, MD.   DRAINS: A 6 x 26 French double-J ureteral stent on a string.   COMPLICATIONS: None.   SPECIMENS: Stone fragment.   INDICATION: This is a 69 year old male with a history of bilateral nephrolithiasis who underwent ESWL nearly 2 weeks ago for treatment of a conglomerate of approximately 1.3 cm stones on the right. Unfortunately he returned to the ER with a presentation of steinstrasse and underwent placement of a ureteral stent and was admitted for overnight observation. He returns today to the operating room for definitive management and treatment of his residual stones on the right. Risks and benefits of the procedure were explained in detail. The patient agreed to proceed as planned.   PROCEDURE IN DETAIL: The patient was correctly identified in the preoperative holding area and informed consent was confirmed. He was brought to the operating suite and placed on the table in supine position. At this time universal timeout protocol was performed. All team members were identified. Venodyne boots were placed and he was administered IV Levaquin in the perioperative period. He was then placed under general anesthesia, repositioned lower on the bed in the dorsal lithotomy position, and prepped and draped in standard surgical fashion. At this point in time a rigid cystoscope using a 2 French access sheath was advanced per urethra into the bladder and the right ureteral stent was seen emanating from the right UO. The distal coil of this was grasped  using stent graspers and brought out to the level of the urethral meatus. This was then cannulated using a 0.038 Sensor wire up to the level of the kidney without difficulty.  The stent was then removed. The wire was snapped in place as a safety wire. A rigid long ureteroscope was then brought in and able to be advanced easily into the distal ureter, at which time multiple small stone fragments were encountered. Each of these was small enough to use a tipless Nitinol basket to basket extract from the ureter. The scope was then advanced to the level to the mid ureter and some larger stone fragments were identified, which were too large to be  basketed intact, the basket got stuck overlying these larger pieces and was unable to be easily extracted. Therefore the basket was broken and left in place. The scope was then returned back up to this level and using a 273 micron laser fiber using the settings of 0.6 joules and 10 Hz these stones were fragmented into some smaller pieces without difficulty. The basket was then able to be extracted without difficulty. Each of these pieces were then basketed individually until all of the stone fragments through the proximal ureter were cleared. I could not go beyond a certain level in the mid to proximal ureter, therefore the scope was exchanged with a 7 French flexible ureteroscope which was advanced to the level of the renal pelvis using a second working wire. At this point in time there was a good amount of very small stone debris noted within the kidney and to efficiently extract the stone I did go ahead and place a 12-14  [place] access sheath up to the level of the proximal ureter to help facilitate basket extraction of these small stone fragments. A large conglomerate of stone was seen in the upper pole calyx as well as a focus in the mid pole calyx as well as the lower pole calyx. Of note the patient's anatomy did reveal a fairly long infundibula making navigation a little  difficult. I was eventually able to extract all major stone fragments from the upper tract collecting system. A retrograde pyelogram was then performed through the scope to create a map of the collecting system. Each individual calyx was then directly visualized and there was deemed to be no significant stone material residual at this point.  I then backed the scope down the ureter, removing the access sheath at the same time, but also visualized the ureter with direct visualization as the access sheath was removed. There was no injury to the ureter noted, nor was there any residual stone fragment along the entire length of the ureter. A 6 x 26 French stent was then placed over the Sensor wire which was back-loaded into the cystoscope to facilitate this.  The scope was placed all the way up to the renal pelvis. The wire was partially withdrawn until a coil was noted within the renal pelvis. The wire was then fully withdrawn and a coil was noted within the bladder. The string was left on the stent and the bladder was drained at the end of the case prior to the scope removal. The stent string was secured to the patient's glans using Mastisol and a Tegaderm. The patient was then cleaned and dried, repositioned in the supine position, reversed from anesthesia and taken to the PACU in stable condition. There were no complications in this case.    ____________________________ Sherlynn Stalls, MD ajb:[id number] D: 07/11/2014 15:52:21 ET T: 07/11/2014 21:12:11 ET JOB#: [id number]  cc: Sherlynn Stalls, MD, <Dictator> Sherlynn Stalls MD ELECTRONICALLY SIGNED 07/23/2014 12:37

## 2014-09-30 ENCOUNTER — Ambulatory Visit
Admission: RE | Admit: 2014-09-30 | Discharge: 2014-09-30 | Disposition: A | Discharge status: Home / Self Care | Payer: Medicare Other | Source: Ambulatory Visit | Attending: Obstetrics and Gynecology | Admitting: Obstetrics and Gynecology

## 2014-09-30 DIAGNOSIS — N1330 Unspecified hydronephrosis: Secondary | ICD-10-CM | POA: Diagnosis present

## 2014-09-30 DIAGNOSIS — N200 Calculus of kidney: Secondary | ICD-10-CM | POA: Diagnosis not present

## 2014-10-11 ENCOUNTER — Other Ambulatory Visit: Payer: Self-pay | Admitting: Internal Medicine

## 2014-10-11 DIAGNOSIS — M545 Low back pain, unspecified: Secondary | ICD-10-CM

## 2014-10-17 ENCOUNTER — Ambulatory Visit
Admission: RE | Admit: 2014-10-17 | Discharge: 2014-10-17 | Disposition: A | Discharge status: Home / Self Care | Payer: Medicare Other | Source: Ambulatory Visit | Attending: Internal Medicine | Admitting: Internal Medicine

## 2014-10-17 DIAGNOSIS — M545 Low back pain, unspecified: Secondary | ICD-10-CM

## 2014-11-01 DIAGNOSIS — I10 Essential (primary) hypertension: Secondary | ICD-10-CM | POA: Insufficient documentation

## 2014-11-26 ENCOUNTER — Ambulatory Visit
Admission: RE | Admit: 2014-11-26 | Discharge: 2014-11-26 | Disposition: A | Discharge status: Home / Self Care | Payer: Medicare Other | Source: Ambulatory Visit | Attending: Urology | Admitting: Urology

## 2014-11-26 ENCOUNTER — Ambulatory Visit: Admission: RE | Admit: 2014-11-26 | Payer: Medicare Other | Source: Ambulatory Visit | Admitting: Urology

## 2014-11-26 ENCOUNTER — Encounter (INDEPENDENT_AMBULATORY_CARE_PROVIDER_SITE_OTHER): Payer: Self-pay

## 2014-11-26 ENCOUNTER — Encounter: Payer: Self-pay | Admitting: Urology

## 2014-11-26 ENCOUNTER — Ambulatory Visit (INDEPENDENT_AMBULATORY_CARE_PROVIDER_SITE_OTHER): Payer: Medicare Other | Admitting: Urology

## 2014-11-26 VITALS — BP 151/83 | HR 75 | Ht 69.0 in | Wt 210.0 lb

## 2014-11-26 DIAGNOSIS — R312 Other microscopic hematuria: Secondary | ICD-10-CM

## 2014-11-26 DIAGNOSIS — N200 Calculus of kidney: Secondary | ICD-10-CM | POA: Insufficient documentation

## 2014-11-26 DIAGNOSIS — E782 Mixed hyperlipidemia: Secondary | ICD-10-CM | POA: Insufficient documentation

## 2014-11-26 DIAGNOSIS — R3129 Other microscopic hematuria: Secondary | ICD-10-CM

## 2014-11-26 DIAGNOSIS — N400 Enlarged prostate without lower urinary tract symptoms: Secondary | ICD-10-CM | POA: Diagnosis not present

## 2014-11-26 DIAGNOSIS — E119 Type 2 diabetes mellitus without complications: Secondary | ICD-10-CM | POA: Insufficient documentation

## 2014-11-26 DIAGNOSIS — Z87891 Personal history of nicotine dependence: Secondary | ICD-10-CM | POA: Insufficient documentation

## 2014-11-26 LAB — MICROSCOPIC EXAMINATION: BACTERIA UA: NONE SEEN

## 2014-11-26 LAB — URINALYSIS, COMPLETE
Bilirubin, UA: NEGATIVE
GLUCOSE, UA: NEGATIVE
KETONES UA: NEGATIVE
Leukocytes, UA: NEGATIVE
Nitrite, UA: NEGATIVE
Protein, UA: NEGATIVE
Specific Gravity, UA: 1.01 (ref 1.005–1.030)
Urobilinogen, Ur: 0.2 mg/dL (ref 0.2–1.0)
pH, UA: 6.5 (ref 5.0–7.5)

## 2014-11-26 NOTE — Patient Instructions (Signed)
Dietary Guidelines to Help Prevent Kidney Stones  Your risk of kidney stones can be decreased by adjusting the foods you eat. The most important thing you can do is drink enough fluid. You should drink enough fluid to keep your urine clear or pale yellow. The following guidelines provide specific information for the type of kidney stone you have had.  GUIDELINES ACCORDING TO TYPE OF KIDNEY STONE  Calcium Oxalate Kidney Stones  · Reduce the amount of salt you eat. Foods that have a lot of salt cause your body to release excess calcium into your urine. The excess calcium can combine with a substance called oxalate to form kidney stones.  · Reduce the amount of animal protein you eat if the amount you eat is excessive. Animal protein causes your body to release excess calcium into your urine. Ask your dietitian how much protein from animal sources you should be eating.  · Avoid foods that are high in oxalates. If you take vitamins, they should have less than 500 mg of vitamin C. Your body turns vitamin C into oxalates. You do not need to avoid fruits and vegetables high in vitamin C.  Calcium Phosphate Kidney Stones  · Reduce the amount of salt you eat to help prevent the release of excess calcium into your urine.  · Reduce the amount of animal protein you eat if the amount you eat is excessive. Animal protein causes your body to release excess calcium into your urine. Ask your dietitian how much protein from animal sources you should be eating.  · Get enough calcium from food or take a calcium supplement (ask your dietitian for recommendations). Food sources of calcium that do not increase your risk of kidney stones include:  ¨ Broccoli.  ¨ Dairy products, such as cheese and yogurt.  ¨ Pudding.  Uric Acid Kidney Stones  · Do not have more than 6 oz of animal protein per day.  FOOD SOURCES  Animal Protein Sources  · Meat (all types).  · Poultry.  · Eggs.  · Fish, seafood.  Foods High in Salt  · Salt seasonings.  · Soy  sauce.  · Teriyaki sauce.  · Cured and processed meats.  · Salted crackers and snack foods.  · Fast food.  · Canned soups and most canned foods.  Foods High in Oxalates  · Grains:  ¨ Amaranth.  ¨ Barley.  ¨ Grits.  ¨ Wheat germ.  ¨ Bran.  ¨ Buckwheat flour.  ¨ All bran cereals.  ¨ Pretzels.  ¨ Whole wheat bread.  · Vegetables:  ¨ Beans (wax).  ¨ Beets and beet greens.  ¨ Collard greens.  ¨ Eggplant.  ¨ Escarole.  ¨ Leeks.  ¨ Okra.  ¨ Parsley.  ¨ Rutabagas.  ¨ Spinach.  ¨ Swiss chard.  ¨ Tomato paste.  ¨ Fried potatoes.  ¨ Sweet potatoes.  · Fruits:  ¨ Red currants.  ¨ Figs.  ¨ Kiwi.  ¨ Rhubarb.  · Meat and Other Protein Sources:  ¨ Beans (dried).  ¨ Soy burgers and other soybean products.  ¨ Miso.  ¨ Nuts (peanuts, almonds, pecans, cashews, hazelnuts).  ¨ Nut butters.  ¨ Sesame seeds and tahini (paste made of sesame seeds).  ¨ Poppy seeds.  · Beverages:  ¨ Chocolate drink mixes.  ¨ Soy milk.  ¨ Instant iced tea.  ¨ Juices made from high-oxalate fruits or vegetables.  · Other:  ¨ Carob.  ¨ Chocolate.  ¨ Fruitcake.  ¨ Marmalades.  Document Released:   09/11/2010 Document Revised: 05/22/2013 Document Reviewed: 04/13/2013  [employer]® Patient Information ©2015 [employer]. This information is not intended to replace advice given to you by your health care provider. Make sure you discuss any questions you have with your health care provider.

## 2014-11-26 NOTE — Progress Notes (Signed)
11/26/2014 11:43 AM   Christopher [name] 02-01-1946 [id number]  Referring provider: Idelle Crouch, MD [place], [place] [address]  Chief Complaint  Patient presents with  . Nephrolithiasis    30month follow up    HPI: 69 yo M with BPH, intermittent gross hematuria, history of kidney stone with large R>L stone burden on CT Urogram. He underwent cystoscopy in 04/2014 which was negative for any evidence of bladder tumor or bladder/prostate pathology.  He underwent S/p RIGHT ESWL on 09/13/6061 complicated by steinstrasse requiring stent placement 06/30/2014. He underwent RIGHT ureteroscopy 07/10/14 to clear his residual stone fragment in his ureter. He tolerated the procedure quite well and removed his own stent which was placed on a string several days after the procedure. He did have residual hydronephrosis on the right on follow up renal ultrasound in March 2016 which resolved on follow up renal utlrasound in 09/2014, stable left renal atrophy.   He denies any flank pain or further episodes of gross hematuria today.    He has tried to increase his water intake and has been adding lemon to his diet.  He does continue to drink tea regularly.    He does have a history of kidney stones in 2010 and 2011. He passed several stones at that time without further intervention. At that time, he was followed by Dr. Cope/ Dr. Bernardo Heater. He was aware of other upper tract stones but did not elect to persue treatement at that time.   He does have History of smoking x 20 years, quit '88.     Most recent PSA 0.18 in 03/2014.  Rectal exam performed today.    He denies any significant urinary symptoms on finasteride/ flomax.  No nocturia or dysuria.   No significant frequency or urgency.      PMH: Past Medical History  Diagnosis Date  . Sleep apnea   . OSA (obstructive sleep apnea)   . Skin cancer   . BPH (benign prostatic hyperplasia)   . Acid reflux   . Renal insufficiency    . Thyroid disease   . Paresthesia of foot   . DM (diabetes mellitus)   . Peripheral neuropathy   . ED (erectile dysfunction)   . HLD (hyperlipidemia)   . CAD (coronary artery disease)   . Hydronephrosis   . Kidney stones   . Gross hematuria   . Atrial fibrillation   . HTN (hypertension)   . Over weight     Surgical History: Past Surgical History  Procedure Laterality Date  . Cardiac catheterization    . Tonsillectomy    . Nasal sinus surgery    . Hernia repair      Home Medications:    Medication List       This list is accurate as of: 11/26/14 11:43 AM.  Always use your most recent med list.               amLODipine 5 MG tablet  Commonly known as:  NORVASC  TAKE 1 BY MOUTH DAILY     atorvastatin 20 MG tablet  Commonly known as:  LIPITOR  TAKE 1 BY MOUTH AT BEDTIME     doxazosin 4 MG tablet  Commonly known as:  CARDURA  TAKE 1 BY MOUTH AT BEDTIME     ELIQUIS 5 MG Tabs tablet  Generic drug:  apixaban  TAKE 1 TABLET BY MOUTH TWICE A DAY     finasteride 5 MG tablet  Commonly known  as:  PROSCAR  Take by mouth.     flecainide 50 MG tablet  Commonly known as:  TAMBOCOR  Take by mouth.     fluticasone 50 MCG/ACT nasal spray  Commonly known as:  FLONASE  USE 2 SPRAYS IN EACH NOSTRIL ONCE DAILY AT BEDTIME     glipiZIDE 10 MG 24 hr tablet  Commonly known as:  GLUCOTROL XL  Take by mouth.     levothyroxine 100 MCG tablet  Commonly known as:  SYNTHROID, LEVOTHROID  Take by mouth.     lisinopril 20 MG tablet  Commonly known as:  PRINIVIL,ZESTRIL  TAKE 1 BY MOUTH TWICE DAILY     metFORMIN 1000 MG tablet  Commonly known as:  GLUCOPHAGE  TAKE 1 BY MOUTH TWICE DAILY     metoprolol succinate 25 MG 24 hr tablet  Commonly known as:  TOPROL-XL  Take by mouth.     ONE TOUCH ULTRA TEST test strip  Generic drug:  glucose blood  TEST twice a day or as directed     pantoprazole 40 MG tablet  Commonly known as:  PROTONIX  TAKE 1 BY MOUTH DAILY      pioglitazone 30 MG tablet  Commonly known as:  ACTOS  Take by mouth.     tamsulosin 0.4 MG Caps capsule  Commonly known as:  FLOMAX  TAKE 1 BY MOUTH DAILY        Allergies:  Allergies  Allergen Reactions  . Etodolac     Other reaction(s): Other (See Comments) GI BLEED  . Penicillins Rash    Family History: Family History  Problem Relation Age of Onset  . Stroke Mother   . Heart attack Father   . Lung cancer Father     Social History:  reports that he quit smoking about 28 years ago. His smoking use included Cigarettes. He smoked 2.00 packs per day. He does not have any smokeless tobacco history on file. He reports that he drinks alcohol. He reports that he does not use illicit drugs.  ROS: Urological Symptom Review  Patient is experiencing the following symptoms: Denies urinary symptoms   Review of Systems  Gastrointestinal (upper)  : Negative for upper GI symptoms  Gastrointestinal (lower) : Diarrhea  Constitutional : Negative for symptoms  Skin: Negative for skin symptoms  Eyes: Negative for eye symptoms  Ear/Nose/Throat : Negative for Ear/Nose/Throat symptoms  Hematologic/Lymphatic: Negative for Hematologic/Lymphatic symptoms  Cardiovascular : palpitations  Respiratory : apnea  Endocrine: Negative for endocrine symptoms  Musculoskeletal: Back pain  Neurological: Negative for neurological symptoms  Psychologic: Negative for psychiatric symptoms   Physical Exam: BP 151/83 mmHg  Pulse 75  Ht 5\' 9"  (1.753 m)  Wt 210 lb (95.255 kg)  BMI 31.00 kg/m2  Constitutional:  Alert and oriented, No acute distress. HEENT: [place] AT, moist mucus membranes.  Trachea midline, no masses. Cardiovascular: No clubbing, cyanosis, or edema. Respiratory: Normal respiratory effort, no increased work of breathing. GI: Abdomen is soft, nontender, nondistended, no abdominal masses GU: No CVA tenderness. DRE today 30 cc prostate, nontender, no masses.  Normal  sphincter tone.   Lymph: No cervical or inguinal adenopathy. Neurologic: Grossly intact, no focal deficits, moving all 4 extremities. Psychiatric: Normal mood and affect.  Laboratory Data: Lab Results  Component Value Date   WBC 10.1 07/01/2014   HGB 12.3* 07/01/2014   HCT 37.2* 07/01/2014   MCV 90 07/01/2014   PLT 196 07/01/2014    Lab Results  Component Value Date  CREATININE 1.04 07/01/2014    Urinalysis UA today shows 11-30 RBC/ HPF, otherwise negative.  Pertinent Imaging: 09/30/14 RUS  CLINICAL DATA: Hydronephrosis.  EXAM: RENAL / URINARY TRACT ULTRASOUND COMPLETE  COMPARISON: 08/21/2014  FINDINGS: Right Kidney:  Length: 11.5 CM. Resolution of hydronephrosis. Normal renal cortical thickness and echogenicity.  Left Kidney:  Length: 12.6 cm. No hydronephrosis. Lower pole 8 mm nonobstructive calculus.  Bladder:  Within normal limits. Bilateral ureteric jets identified.  IMPRESSION: 1. Resolution of right-sided hydronephrosis. 2. Left nephrolithiasis.     Assessment & Plan:  69 yo with BPH, kidney stones s/p R ESWL/ URS, gross/ microscopic hematuria.  1. Kidney stones S/p recent ESWL complicated by steinstrasse with complete resolution of right hydronephrosis.  Residual left nonobstructing stone. We discussed general stone prevention techniques including drinking plenty water with goal of producing 2.5 L urine daily, increased citric acid intake, avoidance of high oxalate containing foods, and decreased salt intake.  Information about dietary recommendations given today. Plan for KUB to establish new baseline.  Recommend metabolic work up - Urinalysis, Complete - Abdomen 1 view (KUB) , will call or message via [place] ordered  2. BPH (benign prostatic hyperplasia) Voiding symptoms stable on finasteride/ flomax.  Continue these meds.    3. Microscopic hematuria Stable microscopic hematuria, on anticoagulation.  Completed workup  in 04/2014 including CT Urogram and cytoscopy.    Return in about 6 months (around 05/28/2015) for PSA, recheck of kidney stones.  Hollice Espy, MD  [facility] [address] [place], [place] [address] [phone]

## 2014-11-28 ENCOUNTER — Telehealth: Payer: Self-pay

## 2014-11-28 NOTE — Telephone Encounter (Signed)
-----   Message from Hollice Espy, MD sent at 11/27/2014  5:25 PM EDT ----- Please let this patient know that he does still have one small stone in the lower pole of his left kidney, but the right side is completely cleared out.  We will see him in 12/16 as scheduled.    Hollice Espy, MD

## 2014-11-28 NOTE — Telephone Encounter (Signed)
Spoke with pt. Made aware of current stone. Pt voiced understanding. Cw,lpn

## 2015-01-28 DIAGNOSIS — H269 Unspecified cataract: Secondary | ICD-10-CM | POA: Insufficient documentation

## 2015-05-30 ENCOUNTER — Telehealth: Payer: Self-pay

## 2015-05-30 ENCOUNTER — Ambulatory Visit (INDEPENDENT_AMBULATORY_CARE_PROVIDER_SITE_OTHER): Payer: Medicare Other | Admitting: Urology

## 2015-05-30 ENCOUNTER — Encounter: Payer: Self-pay | Admitting: Urology

## 2015-05-30 ENCOUNTER — Ambulatory Visit
Admission: RE | Admit: 2015-05-30 | Discharge: 2015-05-30 | Disposition: A | Discharge status: Home / Self Care | Payer: Medicare Other | Source: Ambulatory Visit | Attending: Urology | Admitting: Urology

## 2015-05-30 VITALS — BP 131/83 | HR 72 | Ht 69.0 in | Wt 213.9 lb

## 2015-05-30 DIAGNOSIS — R3129 Other microscopic hematuria: Secondary | ICD-10-CM

## 2015-05-30 DIAGNOSIS — I878 Other specified disorders of veins: Secondary | ICD-10-CM | POA: Insufficient documentation

## 2015-05-30 DIAGNOSIS — N200 Calculus of kidney: Secondary | ICD-10-CM | POA: Insufficient documentation

## 2015-05-30 DIAGNOSIS — N400 Enlarged prostate without lower urinary tract symptoms: Secondary | ICD-10-CM

## 2015-05-30 LAB — MICROSCOPIC EXAMINATION: EPITHELIAL CELLS (NON RENAL): NONE SEEN /HPF (ref 0–10)

## 2015-05-30 LAB — URINALYSIS, COMPLETE
Bilirubin, UA: NEGATIVE
Ketones, UA: NEGATIVE
LEUKOCYTES UA: NEGATIVE
NITRITE UA: NEGATIVE
PH UA: 6 (ref 5.0–7.5)
Protein, UA: NEGATIVE
Specific Gravity, UA: 1.01 (ref 1.005–1.030)
Urobilinogen, Ur: 0.2 mg/dL (ref 0.2–1.0)

## 2015-05-30 NOTE — Telephone Encounter (Signed)
-----   Message from Hollice Espy, MD sent at 05/30/2015 12:52 PM EST ----- Please let this patient noted that his KUB is stable. The 7 mm stone is in the exact same position and has not grown in size. We will see him in one year.

## 2015-05-30 NOTE — Progress Notes (Signed)
1:22 PM  05/30/2015   Christopher [name] [birth year] [id number]  Referring provider: Idelle Crouch, MD [facility] [place], [place] [address]  Chief Complaint  Patient presents with  . Benign Prostatic Hypertrophy    36month with PSA  . Nephrolithiasis    HPI: 69 yo M with BPH, intermittent gross hematuria, history of kidney stones. He returns today for routine follow-up.  Kidney stones- Personal history of stones. Bilateral stones on CTU, R>L 05/2014 S/p RIGHT ESWL on [id number] complicated by steinstrasse requiring stent placement 06/30/2014. He underwent RIGHT ureteroscopy 07/10/14 to clear his residual stone fragment in his ureter.  Follow up renal ultrasound showed eventual resolution of right hydronephrosis.  No recurrence of flank pain since that time Patient advised to undergo metabolic workup but did not have ltitholink as recommended.   Gross hematuria- Resolved, s/p work up including CT Urogram and cystoscopy 04/2014  BPH- Voiding well, no complaints today DRE unremarkable at last visit 10/2014 PSA recently check by PCP 04/2015, stably low On finasteride/ flomax   PMH: Past Medical History  Diagnosis Date  . Sleep apnea   . OSA (obstructive sleep apnea)   . Skin cancer   . BPH (benign prostatic hyperplasia)   . Acid reflux   . Renal insufficiency   . Thyroid disease   . Paresthesia of foot   . DM (diabetes mellitus) ([place])   . Peripheral neuropathy ([place])   . ED (erectile dysfunction)   . HLD (hyperlipidemia)   . CAD (coronary artery disease)   . Hydronephrosis   . Kidney stones   . Gross hematuria   . Atrial fibrillation ([place])   . HTN (hypertension)   . Over weight     Surgical History: Past Surgical History  Procedure Laterality Date  . Cardiac catheterization    . Tonsillectomy    . Nasal sinus surgery    . Hernia repair      Home Medications:    Medication List       This list is accurate as of:  05/30/15  1:22 PM.  Always use your most recent med list.               amLODipine 5 MG tablet  Commonly known as:  NORVASC  TAKE 1 BY MOUTH DAILY     atorvastatin 20 MG tablet  Commonly known as:  LIPITOR  TAKE 1 BY MOUTH AT BEDTIME     carvedilol 12.5 MG tablet  Commonly known as:  COREG  Take by mouth.     doxazosin 4 MG tablet  Commonly known as:  CARDURA  TAKE 1 BY MOUTH AT BEDTIME     ELIQUIS 5 MG Tabs tablet  Generic drug:  apixaban  TAKE 1 TABLET BY MOUTH TWICE A DAY     finasteride 5 MG tablet  Commonly known as:  PROSCAR  Take by mouth.     flecainide 50 MG tablet  Commonly known as:  TAMBOCOR  Take by mouth.     fluticasone 50 MCG/ACT nasal spray  Commonly known as:  FLONASE  USE 2 SPRAYS IN EACH NOSTRIL ONCE DAILY AT BEDTIME     glipiZIDE 10 MG 24 hr tablet  Commonly known as:  GLUCOTROL XL  Take by mouth.     INVOKANA 300 MG Tabs tablet  Generic drug:  canagliflozin  Take by mouth.     levothyroxine 100 MCG tablet  Commonly known as:  SYNTHROID, LEVOTHROID  Take  by mouth.     lisinopril 20 MG tablet  Commonly known as:  PRINIVIL,ZESTRIL  TAKE 1 BY MOUTH TWICE DAILY     metoprolol succinate 25 MG 24 hr tablet  Commonly known as:  TOPROL-XL  Take by mouth.     ONE TOUCH ULTRA TEST test strip  Generic drug:  glucose blood  TEST twice a day or as directed     pantoprazole 40 MG tablet  Commonly known as:  PROTONIX  TAKE 1 BY MOUTH DAILY     pioglitazone 30 MG tablet  Commonly known as:  ACTOS  Take by mouth.     tamsulosin 0.4 MG Caps capsule  Commonly known as:  FLOMAX  TAKE 1 BY MOUTH DAILY        Allergies:  Allergies  Allergen Reactions  . Etodolac     Other reaction(s): Other (See Comments) GI BLEED  . Penicillins Rash    Family History: Family History  Problem Relation Age of Onset  . Stroke Mother   . Heart attack Father   . Lung cancer Father     Social History:  reports that he quit smoking about 29 years  ago. His smoking use included Cigarettes. He smoked 2.00 packs per day. He does not have any smokeless tobacco history on file. He reports that he drinks alcohol. He reports that he does not use illicit drugs.  ROS: UROLOGY Frequent Urination?: No Hard to postpone urination?: No Burning/pain with urination?: No Get up at night to urinate?: No Leakage of urine?: No Urine stream starts and stops?: No Trouble starting stream?: No Do you have to strain to urinate?: No Blood in urine?: No Urinary tract infection?: No Sexually transmitted disease?: No Injury to kidneys or bladder?: No Painful intercourse?: No Weak stream?: No Erection problems?: No Penile pain?: No Gastrointestinal Nausea?: No Vomiting?: No Indigestion/heartburn?: No Diarrhea?: No Constipation?: No Constitutional Fever: No Night sweats?: No Weight loss?: No Fatigue?: No Skin Skin rash/lesions?: No Itching?: No Eyes Blurred vision?: No Double vision?: No Ears/Nose/Throat Sore throat?: No Sinus problems?: No Hematologic/Lymphatic Swollen glands?: No Easy bruising?: No Cardiovascular Leg swelling?: No Chest pain?: No Respiratory Cough?: No Shortness of breath?: No Endocrine Excessive thirst?: No Musculoskeletal Back pain?: No Joint pain?: No Neurological Headaches?: No Dizziness?: No Psychologic Depression?: No Anxiety?: No    Physical Exam: BP 131/83 mmHg  Pulse 72  Ht 5\' 9"  (1.753 m)  Wt 213 lb 14.4 oz (97.024 kg)  BMI 31.57 kg/m2  Constitutional:  Alert and oriented, No acute distress. HEENT: [place] AT, moist mucus membranes.  Trachea midline, no masses. Cardiovascular: No clubbing, cyanosis, or edema. Respiratory: Normal respiratory effort, no increased work of breathing. GI: Abdomen is soft, nontender, nondistended, no abdominal masses GU: No CVA tenderness. DRE deferred, normal at last visit Summer 2016.  Neurologic: Grossly intact, no focal deficits, moving all 4  extremities. Psychiatric: Normal mood and affect.  Laboratory Data: Lab Results  Component Value Date   WBC 10.1 07/01/2014   HGB 12.3* 07/01/2014   HCT 37.2* 07/01/2014   MCV 90 07/01/2014   PLT 196 07/01/2014     Comprehensive Metabolic Panel (CMP) (A999333 9:49 AM) Comprehensive Metabolic Panel (CMP) (A999333 9:49 AM)  Component Value Ref Range  Glucose 136 (H) 70 - 110 mg/dL  Sodium 139 136 - 145 mmol/L  Potassium 3.6 3.6 - 5.1 mmol/L  Chloride 103 97 - 109 mmol/L  Carbon Dioxide (CO2) 23.0 22.0 - 32.0 mmol/L  Urea Nitrogen (BUN) 20  7 - 25 mg/dL  Creatinine 1.1 0.7 - 1.3 mg/dL   PSA, Total (Screen) (04/03/2015 9:49 AM) PSA, Total (Screen) (04/03/2015 9:49 AM)  Component Value Ref Range  PSA (Prostate Specific Antigen), Total 0.21 0.10 - 4.00 ng/mL    Urinalysis Results for orders placed or performed in visit on 05/30/15  Microscopic Examination  Result Value Ref Range   WBC, UA 0-5 0 -  5 /hpf   RBC, UA 0-2 0 -  2 /hpf   Epithelial Cells (non renal) None seen 0 - 10 /hpf   Bacteria, UA Present (A) None seen/Few  Urinalysis, Complete  Result Value Ref Range   Specific Gravity, UA 1.010 1.005 - 1.030   pH, UA 6.0 5.0 - 7.5   Color, UA Yellow Yellow   Appearance Ur Clear Clear   Leukocytes, UA Negative Negative   Protein, UA Negative Negative/Trace   Glucose, UA 3+ (A) Negative   Ketones, UA Negative Negative   RBC, UA Trace (A) Negative   Bilirubin, UA Negative Negative   Urobilinogen, Ur 0.2 0.2 - 1.0 mg/dL   Nitrite, UA Negative Negative   Microscopic Examination See below:      Pertinent Imaging:  CLINICAL DATA: Left-sided nephrolithiasis  EXAM: ABDOMEN - 1 VIEW  COMPARISON: November 26, 2014  FINDINGS: There is a stable 7 mm calculus in the lower pole left kidney. There are apparent phleboliths in the pelvis. There are no new calcifications. There is moderate stool in the colon. There is no bowel obstruction or free air  appreciable.  IMPRESSION: Stable 7 mm calculus lower pole left kidney. Stable apparent phleboliths in the pelvis. No new calcifications. Bowel gas pattern unremarkable.   Electronically Signed  By: Lowella Grip III M.D.  On: 05/30/2015 10:57      Assessment & Plan:  69 yo with BPH, kidney stones s/p R ESWL/ URS, gross/ microscopic hematuria.  1. Kidney stones KUB today with antibiotic 7 mm left lower pole stone. Patient does not desire any treatment. Patient reminded of stone diet. -recommended Litholink, patient refused -KUB today -RTC in 1 year KUB sooner if develops flank pain or any other worrisome symptoms.  2. BPH (benign prostatic hyperplasia) Voiding symptoms stable on finasteride/ flomax.  Continue these meds.   PSA/DRE up-to-date.  3. Microscopic hematuria Completed workup in 04/2014 including CT Urogram and cytoscopy.  No further microscopic blood today.  Return in about 1 year (around 05/29/2016) for KUB .  Hollice Espy, MD  [facility] [address] [place], [place] [address] [phone]

## 2015-05-30 NOTE — Telephone Encounter (Signed)
LMOM

## 2015-06-03 NOTE — Telephone Encounter (Signed)
Spoke with pt in reference to KUB and stone. Pt voiced understanding.

## 2016-03-25 ENCOUNTER — Encounter: Payer: Self-pay | Admitting: *Deleted

## 2016-03-26 ENCOUNTER — Ambulatory Visit: Payer: Medicare Other | Admitting: Anesthesiology

## 2016-03-26 ENCOUNTER — Encounter
Admission: RE | Disposition: A | Discharge status: Home / Self Care | Payer: Self-pay | Source: Ambulatory Visit | Attending: Gastroenterology

## 2016-03-26 ENCOUNTER — Ambulatory Visit
Admission: RE | Admit: 2016-03-26 | Discharge: 2016-03-26 | Disposition: A | Discharge status: Home / Self Care | Payer: Medicare Other | Source: Ambulatory Visit | Attending: Gastroenterology | Admitting: Gastroenterology

## 2016-03-26 ENCOUNTER — Encounter: Payer: Self-pay | Admitting: *Deleted

## 2016-03-26 DIAGNOSIS — Z87442 Personal history of urinary calculi: Secondary | ICD-10-CM | POA: Insufficient documentation

## 2016-03-26 DIAGNOSIS — K5730 Diverticulosis of large intestine without perforation or abscess without bleeding: Secondary | ICD-10-CM | POA: Diagnosis not present

## 2016-03-26 DIAGNOSIS — Z5309 Procedure and treatment not carried out because of other contraindication: Secondary | ICD-10-CM | POA: Diagnosis not present

## 2016-03-26 DIAGNOSIS — Z7901 Long term (current) use of anticoagulants: Secondary | ICD-10-CM | POA: Diagnosis not present

## 2016-03-26 DIAGNOSIS — Z888 Allergy status to other drugs, medicaments and biological substances status: Secondary | ICD-10-CM | POA: Insufficient documentation

## 2016-03-26 DIAGNOSIS — N400 Enlarged prostate without lower urinary tract symptoms: Secondary | ICD-10-CM | POA: Insufficient documentation

## 2016-03-26 DIAGNOSIS — Z85828 Personal history of other malignant neoplasm of skin: Secondary | ICD-10-CM | POA: Diagnosis not present

## 2016-03-26 DIAGNOSIS — K2970 Gastritis, unspecified, without bleeding: Secondary | ICD-10-CM | POA: Insufficient documentation

## 2016-03-26 DIAGNOSIS — Z8711 Personal history of peptic ulcer disease: Secondary | ICD-10-CM | POA: Diagnosis not present

## 2016-03-26 DIAGNOSIS — G4733 Obstructive sleep apnea (adult) (pediatric): Secondary | ICD-10-CM | POA: Diagnosis not present

## 2016-03-26 DIAGNOSIS — Z7984 Long term (current) use of oral hypoglycemic drugs: Secondary | ICD-10-CM | POA: Insufficient documentation

## 2016-03-26 DIAGNOSIS — I10 Essential (primary) hypertension: Secondary | ICD-10-CM | POA: Diagnosis not present

## 2016-03-26 DIAGNOSIS — K317 Polyp of stomach and duodenum: Secondary | ICD-10-CM | POA: Diagnosis not present

## 2016-03-26 DIAGNOSIS — Z87891 Personal history of nicotine dependence: Secondary | ICD-10-CM | POA: Insufficient documentation

## 2016-03-26 DIAGNOSIS — Z79899 Other long term (current) drug therapy: Secondary | ICD-10-CM | POA: Insufficient documentation

## 2016-03-26 DIAGNOSIS — I2510 Atherosclerotic heart disease of native coronary artery without angina pectoris: Secondary | ICD-10-CM | POA: Diagnosis not present

## 2016-03-26 DIAGNOSIS — R109 Unspecified abdominal pain: Secondary | ICD-10-CM | POA: Diagnosis present

## 2016-03-26 DIAGNOSIS — K219 Gastro-esophageal reflux disease without esophagitis: Secondary | ICD-10-CM | POA: Diagnosis not present

## 2016-03-26 DIAGNOSIS — K2210 Ulcer of esophagus without bleeding: Secondary | ICD-10-CM | POA: Insufficient documentation

## 2016-03-26 DIAGNOSIS — Z6829 Body mass index (BMI) 29.0-29.9, adult: Secondary | ICD-10-CM | POA: Insufficient documentation

## 2016-03-26 DIAGNOSIS — E785 Hyperlipidemia, unspecified: Secondary | ICD-10-CM | POA: Insufficient documentation

## 2016-03-26 DIAGNOSIS — E039 Hypothyroidism, unspecified: Secondary | ICD-10-CM | POA: Insufficient documentation

## 2016-03-26 DIAGNOSIS — N289 Disorder of kidney and ureter, unspecified: Secondary | ICD-10-CM | POA: Diagnosis not present

## 2016-03-26 DIAGNOSIS — K921 Melena: Secondary | ICD-10-CM | POA: Insufficient documentation

## 2016-03-26 DIAGNOSIS — Z880 Allergy status to penicillin: Secondary | ICD-10-CM | POA: Diagnosis not present

## 2016-03-26 DIAGNOSIS — I4891 Unspecified atrial fibrillation: Secondary | ICD-10-CM | POA: Insufficient documentation

## 2016-03-26 DIAGNOSIS — E1142 Type 2 diabetes mellitus with diabetic polyneuropathy: Secondary | ICD-10-CM | POA: Diagnosis not present

## 2016-03-26 HISTORY — PX: COLONOSCOPY: SHX5424

## 2016-03-26 HISTORY — PX: ESOPHAGOGASTRODUODENOSCOPY (EGD) WITH PROPOFOL: SHX5813

## 2016-03-26 HISTORY — DX: Hypothyroidism, unspecified: E03.9

## 2016-03-26 LAB — GLUCOSE, CAPILLARY: Glucose-Capillary: 106 mg/dL — ABNORMAL HIGH (ref 65–99)

## 2016-03-26 SURGERY — ESOPHAGOGASTRODUODENOSCOPY (EGD) WITH PROPOFOL
Anesthesia: General

## 2016-03-26 MED ORDER — SODIUM CHLORIDE 0.9 % IV SOLN: INTRAVENOUS | Status: DC

## 2016-03-26 MED ORDER — MIDAZOLAM HCL 2 MG/2ML IJ SOLN
INTRAMUSCULAR | Status: DC | PRN
  Administered 2016-03-26: 1 mg via INTRAVENOUS

## 2016-03-26 MED ORDER — PROPOFOL 500 MG/50ML IV EMUL
INTRAVENOUS | Status: DC | PRN
  Administered 2016-03-26: 140 ug/kg/min via INTRAVENOUS

## 2016-03-26 MED ORDER — PHENYLEPHRINE HCL 10 MG/ML IJ SOLN
INTRAMUSCULAR | Status: DC | PRN
  Administered 2016-03-26: 100 ug via INTRAVENOUS

## 2016-03-26 MED ORDER — PROPOFOL 10 MG/ML IV BOLUS
INTRAVENOUS | Status: DC | PRN
  Administered 2016-03-26: 20 mg via INTRAVENOUS
  Administered 2016-03-26 (×2): 30 mg via INTRAVENOUS

## 2016-03-26 MED ORDER — LIDOCAINE HCL (CARDIAC) 20 MG/ML IV SOLN
INTRAVENOUS | Status: DC | PRN
  Administered 2016-03-26: 30 mg via INTRAVENOUS

## 2016-03-26 MED ORDER — SODIUM CHLORIDE 0.9 % IV SOLN
INTRAVENOUS | Status: DC
  Administered 2016-03-26: 1000 mL via INTRAVENOUS

## 2016-03-26 NOTE — Anesthesia Preprocedure Evaluation (Signed)
Anesthesia Evaluation  Patient identified by MRN, date of birth, ID band Patient awake    Reviewed: Allergy & Precautions, NPO status , Patient's Chart, lab work & pertinent test results  Airway Mallampati: III       Dental  (+) Teeth Intact   Pulmonary neg pulmonary ROS, sleep apnea and Continuous Positive Airway Pressure Ventilation , former smoker,    breath sounds clear to auscultation       Cardiovascular Exercise Tolerance: Good hypertension, Pt. on medications and Pt. on home beta blockers + CAD  + dysrhythmias Atrial Fibrillation  Rhythm:Regular Rate:Normal     Neuro/Psych    GI/Hepatic Neg liver ROS, GERD  Medicated,  Endo/Other  diabetes, Type 2, Oral Hypoglycemic AgentsHypothyroidism Morbid obesity  Renal/GU      Musculoskeletal   Abdominal (+) + obese,   Peds negative pediatric ROS (+)  Hematology negative hematology ROS (+)   Anesthesia Other Findings   Reproductive/Obstetrics                             Anesthesia Physical Anesthesia Plan  ASA: III  Anesthesia Plan: General   Post-op Pain Management:    Induction: Intravenous  Airway Management Planned: Natural Airway and Nasal Cannula  Additional Equipment:   Intra-op Plan:   Post-operative Plan:   Informed Consent: I have reviewed the patients History and Physical, chart, labs and discussed the procedure including the risks, benefits and alternatives for the proposed anesthesia with the patient or authorized representative who has indicated his/her understanding and acceptance.     Plan Discussed with: CRNA  Anesthesia Plan Comments:         Anesthesia Quick Evaluation

## 2016-03-26 NOTE — Transfer of Care (Signed)
Immediate Anesthesia Transfer of Care Note  Patient: Christopher [name]  Procedure(s) Performed: Procedure(s) with comments: ESOPHAGOGASTRODUODENOSCOPY (EGD) WITH PROPOFOL (N/A) - Diabetic COLONOSCOPY (N/A)  Patient Location: PACU  Anesthesia Type:General  Level of Consciousness: sedated  Airway & Oxygen Therapy: Patient Spontanous Breathing and Patient connected to nasal cannula oxygen  [name]-op Assessment: Report given to RN and [name] -op Vital signs reviewed and stable  [name] vital signs: Reviewed and stable  Last Vitals:  Vitals:   03/26/16 0748 03/26/16 0951  BP: (!) 141/80 109/84  Pulse: 66 69  Resp: 20 19  Temp: 36.6 C     Last Pain:  Vitals:   03/26/16 0951  TempSrc: Tympanic         Complications: No apparent anesthesia complications

## 2016-03-26 NOTE — Anesthesia Procedure Notes (Signed)
Date/Time: 03/26/2016 9:06 AM Performed by: Johnna Acosta Pre-anesthesia Checklist: Patient identified, Emergency Drugs available, Suction available, Patient being monitored and Timeout performed Patient Re-evaluated:Patient Re-evaluated prior to inductionOxygen Delivery Method: Nasal cannula

## 2016-03-26 NOTE — Anesthesia Postprocedure Evaluation (Signed)
Anesthesia Post Note  Patient: Christopher [name]  Procedure(s) Performed: Procedure(s) (LRB): ESOPHAGOGASTRODUODENOSCOPY (EGD) WITH PROPOFOL (N/A) COLONOSCOPY (N/A)  Patient location during evaluation: PACU Anesthesia Type: General Level of consciousness: awake and awake and alert Pain management: satisfactory to patient Vital Signs Assessment: post-procedure vital signs reviewed and stable Cardiovascular status: stable Anesthetic complications: no    Last Vitals:  Vitals:   03/26/16 0748 03/26/16 0951  BP: (!) 141/80 109/84  Pulse: 66 69  Resp: 20 19  Temp: 36.6 C (!) 35.9 C    Last Pain:  Vitals:   03/26/16 0951  TempSrc: Tympanic                 VAN STAVEREN,Emslee Lopezmartinez

## 2016-03-26 NOTE — OR Nursing (Signed)
Procedure aborted due to very poor prep.

## 2016-03-26 NOTE — Op Note (Signed)
[facility] Gastroenterology Patient Name: Christopher [name] Procedure Date: 03/26/2016 9:03 AM MRN: [id number] Account #: [id number] Date of Birth: 19-Oct-1945 Admit Type: Outpatient Age: 70 Room: [facility] ENDO ROOM 1 Gender: Male Note Status: Finalized Procedure:            Colonoscopy Indications:          Abdominal pain in the left lower quadrant, Heme                        positive stool Providers:            [name], MD Referring MD:         Leonie Douglas. Doy Hutching, MD (Referring MD) Medicines:            Monitored Anesthesia Care Complications:        No immediate complications. Procedure:            Pre-Anesthesia Assessment:                       - ASA Grade Assessment: III - A patient with severe                        systemic disease.                       After obtaining informed consent, the colonoscope was                        passed under direct vision. Throughout the procedure,                        the patient's blood pressure, pulse, and oxygen                        saturations were monitored continuously. The                        Colonoscope was introduced through the anus with the                        intention of advancing to the cecum. The scope was                        advanced to the descending colon before the procedure                        was aborted. Medications were given. The colonoscopy                        was extremely difficult due to poor bowel prep with                        stool present. The patient tolerated the procedure                        well. The quality of the bowel preparation was poor. Findings:      Multiple small and large-mouthed diverticula were found in the sigmoid       colon and descending colon.      A large amount of semi-liquid solid stool was found in the  rectum, in       the sigmoid colon and in the descending colon, precluding visualization.      The digital rectal exam was  normal. Impression:           - Preparation of the colon was poor.                       - Diverticulosis in the sigmoid colon and in the                        descending colon.                       - Stool in the rectum, in the sigmoid colon and in the                        descending colon.                       - No specimens collected. Recommendation:       - Discharge patient to home.                       - repeat prep and reschedule. Procedure Code(s):    --- Professional ---                       [phone], 53, Colonoscopy, flexible; diagnostic, including                        collection of specimen(s) by brushing or washing, when                        performed (separate procedure) Diagnosis Code(s):    --- Professional ---                       R10.32, Left lower quadrant pain                       R19.5, Other fecal abnormalities                       K57.30, Diverticulosis of large intestine without                        perforation or abscess without bleeding CPT copyright 2016 American Medical Association. All rights reserved. The codes documented in this report are preliminary and upon coder review may  be revised to meet current compliance requirements. [name], MD 03/26/2016 9:52:06 AM This report has been signed electronically. Number of Addenda: 0 Note Initiated On: 03/26/2016 9:03 AM Total Procedure Duration: 0 hours 16 minutes 23 seconds       [facility]

## 2016-03-26 NOTE — H&P (Signed)
Outpatient short stay form Pre-procedure 03/26/2016 9:01 AM Christopher Sails MD  Primary Physician: Dr. Fulton Reek  Reason for visit:  EGD and colonoscopy  History of present illness:  Patient is a 70 year old male presenting today as above. He was recently found to be Hemoccult-positive. It is of note however he does take eliquis was. He has been having some chronic intermittent left lower quadrant abdominal pain in the setting of known history of diverticulitis in the past. He is also had a gastric ulcer in the past. Does take a daily proton pump inhibitor. He tolerated his prep well. He takes no aspirin products. He has held his L) for 5 days.    Current Facility-Administered Medications:  .  0.9 %  sodium chloride infusion, , Intravenous, Continuous, Christopher Sails, MD, Last Rate: 20 mL/hr at 03/26/16 0804, 1,000 mL at 03/26/16 0804 .  0.9 %  sodium chloride infusion, , Intravenous, Continuous, Christopher Sails, MD  Prescriptions Prior to Admission  Medication Sig Dispense Refill Last Dose  . apixaban (ELIQUIS) 5 MG TABS tablet TAKE 1 TABLET BY MOUTH TWICE A DAY   03/21/2016 at Unknown time  . carvedilol (COREG) 12.5 MG tablet Take by mouth.   03/25/2016 at 0800  . amLODipine (NORVASC) 5 MG tablet TAKE 1 BY MOUTH DAILY   Taking  . atorvastatin (LIPITOR) 20 MG tablet TAKE 1 BY MOUTH AT BEDTIME   Taking  . canagliflozin (INVOKANA) 300 MG TABS tablet Take by mouth.   Taking  . doxazosin (CARDURA) 4 MG tablet TAKE 1 BY MOUTH AT BEDTIME   Taking  . finasteride (PROSCAR) 5 MG tablet Take by mouth.   Taking  . flecainide (TAMBOCOR) 50 MG tablet Take by mouth.   Taking  . fluticasone (FLONASE) 50 MCG/ACT nasal spray USE 2 SPRAYS IN EACH NOSTRIL ONCE DAILY AT BEDTIME   Taking  . glipiZIDE (GLUCOTROL XL) 10 MG 24 hr tablet Take by mouth.   Taking  . glucose blood (ONE TOUCH ULTRA TEST) test strip TEST twice a day or as directed   Taking  . levothyroxine (SYNTHROID, LEVOTHROID) 100 MCG  tablet Take by mouth.   Taking  . lisinopril (PRINIVIL,ZESTRIL) 20 MG tablet TAKE 1 BY MOUTH TWICE DAILY   Taking  . metoprolol succinate (TOPROL-XL) 25 MG 24 hr tablet Take by mouth.   Taking  . pantoprazole (PROTONIX) 40 MG tablet TAKE 1 BY MOUTH DAILY   Taking  . pioglitazone (ACTOS) 30 MG tablet Take by mouth.   Taking  . tamsulosin (FLOMAX) 0.4 MG CAPS capsule TAKE 1 BY MOUTH DAILY   Taking     Allergies  Allergen Reactions  . Etodolac     Other reaction(s): Other (See Comments) GI BLEED  . Penicillins Rash     Past Medical History:  Diagnosis Date  . Acid reflux   . Atrial fibrillation ([place])   . BPH (benign prostatic hyperplasia)   . CAD (coronary artery disease)   . DM (diabetes mellitus) ([place])   . ED (erectile dysfunction)   . Gross hematuria   . HLD (hyperlipidemia)   . HTN (hypertension)   . Hydronephrosis   . Hypothyroidism   . Kidney stones   . OSA (obstructive sleep apnea)   . Over weight   . Paresthesia of foot   . Peripheral neuropathy ([place])   . Renal insufficiency   . Skin cancer   . Sleep apnea   . Thyroid disease     Review of  systems:      Physical Exam    Heart and lungs: Regular rate and rhythm without rub or gallop, lungs are bilaterally clear.    HEENT: Normocephalic atraumatic eyes are anicteric    Other:     Pertinant exam for procedure: Soft nontender nondistended bowel sounds positive normoactive, mild protuberance.    Planned proceedures: EGD, colonoscopy and indicated procedures. I have discussed the risks benefits and complications of procedures to include not limited to bleeding, infection, perforation and the risk of sedation and the patient wishes to proceed.    Christopher Sails, MD Gastroenterology 03/26/2016  9:01 AM

## 2016-03-26 NOTE — Op Note (Signed)
[facility] Gastroenterology Patient Name: Christopher [name] Procedure Date: 03/26/2016 9:03 AM MRN: [id number] Account #: [id number] Date of Birth: 1945-11-24 Admit Type: Outpatient Age: 70 Room: [facility] ENDO ROOM 1 Gender: Male Note Status: Finalized Procedure:            Upper GI endoscopy Indications:          Heme positive stool Providers:            [name], MD Referring MD:         Leonie Douglas. Doy Hutching, MD (Referring MD) Medicines:            Monitored Anesthesia Care Complications:        No immediate complications. Procedure:            Pre-Anesthesia Assessment:                       - ASA Grade Assessment: III - A patient with severe                        systemic disease.                       After obtaining informed consent, the endoscope was                        passed under direct vision. Throughout the procedure,                        the patient's blood pressure, pulse, and oxygen                        saturations were monitored continuously. The Endoscope                        was introduced through the mouth, and advanced to the                        third part of duodenum. The upper GI endoscopy was                        accomplished without difficulty. The patient tolerated                        the procedure well. Findings:      The Z-line was irregular. Biopsies were taken with a cold forceps for       histology.      LA Grade A (one or more mucosal breaks less than 5 mm, not extending       between tops of 2 mucosal folds) esophagitis with no bleeding was found.      The exam of the esophagus was otherwise normal.      Patchy mild inflammation characterized by congestion (edema) and       erythema was found in the gastric antrum. Biopsies were taken with a       cold forceps for histology. Biopsies were taken with a cold forceps for       Helicobacter pylori testing.      Patchy mild inflammation characterized by erythema was  found in the       duodenal bulb.      A single 5 mm pedunculated polyp  with no bleeding and no stigmata of       recent bleeding was found on the anterior wall of the gastric body. The       polyp was removed with a cold biopsy forceps. Resection and retrieval       were complete. Impression:           - Z-line irregular. Biopsied.                       - LA Grade A erosive esophagitis.                       - Gastritis. Biopsied.                       - Duodenitis.                       - A single gastric polyp. Resected and retrieved. Recommendation:       - Perform a colonoscopy today. Procedure Code(s):    --- Professional ---                       [phone], Esophagogastroduodenoscopy, flexible, transoral;                        with biopsy, single or multiple Diagnosis Code(s):    --- Professional ---                       K22.8, Other specified diseases of esophagus                       K20.8, Other esophagitis                       K29.70, Gastritis, unspecified, without bleeding                       K29.80, Duodenitis without bleeding                       K31.7, Polyp of stomach and duodenum                       R19.5, Other fecal abnormalities CPT copyright 2016 American Medical Association. All rights reserved. The codes documented in this report are preliminary and upon coder review may  be revised to meet current compliance requirements. [name], MD 03/26/2016 9:29:36 AM This report has been signed electronically. Number of Addenda: 0 Note Initiated On: 03/26/2016 9:03 AM      [facility]

## 2016-03-29 ENCOUNTER — Encounter: Payer: Self-pay | Admitting: Gastroenterology

## 2016-03-29 LAB — SURGICAL PATHOLOGY

## 2016-05-17 ENCOUNTER — Ambulatory Visit
Admission: RE | Admit: 2016-05-17 | Discharge: 2016-05-17 | Disposition: A | Discharge status: Home / Self Care | Payer: Medicare Other | Source: Ambulatory Visit | Attending: Urology | Admitting: Urology

## 2016-05-17 ENCOUNTER — Other Ambulatory Visit: Payer: Self-pay

## 2016-05-17 DIAGNOSIS — N200 Calculus of kidney: Secondary | ICD-10-CM | POA: Diagnosis present

## 2016-05-17 DIAGNOSIS — Z87442 Personal history of urinary calculi: Secondary | ICD-10-CM | POA: Diagnosis not present

## 2016-05-21 ENCOUNTER — Ambulatory Visit (INDEPENDENT_AMBULATORY_CARE_PROVIDER_SITE_OTHER): Payer: Medicare Other | Admitting: Urology

## 2016-05-21 ENCOUNTER — Encounter: Payer: Self-pay | Admitting: Urology

## 2016-05-21 ENCOUNTER — Other Ambulatory Visit: Payer: Self-pay | Admitting: *Deleted

## 2016-05-21 VITALS — BP 132/85 | HR 62 | Ht 70.0 in | Wt 219.0 lb

## 2016-05-21 DIAGNOSIS — N200 Calculus of kidney: Secondary | ICD-10-CM | POA: Diagnosis not present

## 2016-05-21 DIAGNOSIS — N400 Enlarged prostate without lower urinary tract symptoms: Secondary | ICD-10-CM | POA: Diagnosis not present

## 2016-05-21 DIAGNOSIS — Z125 Encounter for screening for malignant neoplasm of prostate: Secondary | ICD-10-CM | POA: Diagnosis not present

## 2016-05-21 NOTE — Progress Notes (Addendum)
12:08 PM  05/20/16  Christopher [name] 12-Nov-1945 [id number]  Referring provider: Idelle Crouch, MD [facility] [place], [place] [address]  Chief Complaint  Patient presents with  . Nephrolithiasis    1 year follow up     HPI: 70 yo M with BPH, intermittent gross hematuria, history of kidney stones. He returns today for annual routine follow-up.  Kidney stones- Personal history of stones. Bilateral stones on CTU, R>L 05/2014 S/p RIGHT ESWL on [id number] complicated by steinstrasse requiring stent placement 06/30/2014. He underwent RIGHT ureteroscopy 07/10/14 to clear his residual stone fragment in his ureter.  Follow up renal ultrasound showed eventual resolution of right hydronephrosis.  Patient advised to undergo metabolic workup but did not have litholink as recommended. He did have brief left lower back pain couple months ago which lasted a few day and resolved, otherwise no pain.  No gross hematuria.   KUB today without stones   Gross hematuria- Resolved, s/p work up including CT Urogram and cystoscopy 04/2014  BPH- Voiding well, no complaints today PSA 0.28 on 04/16/16, due for rectal today On finasteride/ flomax   PMH: Past Medical History:  Diagnosis Date  . Acid reflux   . Atrial fibrillation ([place])   . BPH (benign prostatic hyperplasia)   . CAD (coronary artery disease)   . DM (diabetes mellitus) ([place])   . ED (erectile dysfunction)   . Gross hematuria   . HLD (hyperlipidemia)   . HTN (hypertension)   . Hydronephrosis   . Hypothyroidism   . Kidney stones   . OSA (obstructive sleep apnea)   . Over weight   . Paresthesia of foot   . Peripheral neuropathy ([place])   . Renal insufficiency   . Skin cancer   . Sleep apnea   . Thyroid disease     Surgical History: Past Surgical History:  Procedure Laterality Date  . CARDIAC CATHETERIZATION    . CATARACT EXTRACTION     both eyes sperate times  . COLONOSCOPY N/A 03/26/2016   Procedure: COLONOSCOPY;  Surgeon: [name], MD;  Location: [facility] ENDOSCOPY;  Service: Endoscopy;  Laterality: N/A;  . ESOPHAGOGASTRODUODENOSCOPY (EGD) WITH PROPOFOL N/A 03/26/2016   Procedure: ESOPHAGOGASTRODUODENOSCOPY (EGD) WITH PROPOFOL;  Surgeon: [name], MD;  Location: [facility] ENDOSCOPY;  Service: Endoscopy;  Laterality: N/A;  Diabetic  . HERNIA REPAIR    . NASAL SINUS SURGERY    . TONSILLECTOMY      Home Medications:  Allergies as of 05/21/2016      Reactions   Etodolac    Other reaction(s): Other (See Comments) GI BLEED   Penicillins Rash      Medication List       Accurate as of 05/21/16 11:59 PM. Always use your most recent med list.          amLODipine 5 MG tablet Commonly known as:  NORVASC TAKE 1 BY MOUTH DAILY   atorvastatin 20 MG tablet Commonly known as:  LIPITOR TAKE 1 BY MOUTH AT BEDTIME   carvedilol 12.5 MG tablet Commonly known as:  COREG Take by mouth.   doxazosin 4 MG tablet Commonly known as:  CARDURA TAKE 1 BY MOUTH AT BEDTIME   ELIQUIS 5 MG Tabs tablet Generic drug:  apixaban TAKE 1 TABLET BY MOUTH TWICE A DAY   finasteride 5 MG tablet Commonly known as:  PROSCAR Take by mouth.   flecainide 50 MG tablet Commonly known as:  TAMBOCOR Take by mouth.  fluticasone 50 MCG/ACT nasal spray Commonly known as:  FLONASE USE 2 SPRAYS IN EACH NOSTRIL ONCE DAILY AT BEDTIME   glipiZIDE 10 MG 24 hr tablet Commonly known as:  GLUCOTROL XL Take by mouth.   INVOKANA 300 MG Tabs tablet Generic drug:  canagliflozin Take by mouth.   levothyroxine 100 MCG tablet Commonly known as:  SYNTHROID, LEVOTHROID Take by mouth.   lisinopril 20 MG tablet Commonly known as:  PRINIVIL,ZESTRIL TAKE 1 BY MOUTH TWICE DAILY   metoprolol succinate 25 MG 24 hr tablet Commonly known as:  TOPROL-XL Take by mouth.   ONE TOUCH ULTRA TEST test strip Generic drug:  glucose blood TEST twice a day or as directed   pantoprazole 40 MG  tablet Commonly known as:  PROTONIX TAKE 1 BY MOUTH DAILY   pioglitazone 30 MG tablet Commonly known as:  ACTOS Take by mouth.   tamsulosin 0.4 MG Caps capsule Commonly known as:  FLOMAX TAKE 1 BY MOUTH DAILY       Allergies:  Allergies  Allergen Reactions  . Etodolac     Other reaction(s): Other (See Comments) GI BLEED  . Penicillins Rash    Family History: Family History  Problem Relation Age of Onset  . Stroke Mother   . Heart attack Father   . Lung cancer Father   . Prostate cancer Neg Hx   . Kidney cancer Neg Hx   . Bladder Cancer Neg Hx     Social History:  reports that he quit smoking about 30 years ago. His smoking use included Cigarettes. He smoked 2.00 packs per day. He has never used smokeless tobacco. He reports that he drinks alcohol. He reports that he does not use drugs.  ROS: UROLOGY Frequent Urination?: No Hard to postpone urination?: No Burning/pain with urination?: No Get up at night to urinate?: No Leakage of urine?: No Urine stream starts and stops?: No Trouble starting stream?: No Do you have to strain to urinate?: No Blood in urine?: No Urinary tract infection?: No Sexually transmitted disease?: No Injury to kidneys or bladder?: No Painful intercourse?: No Weak stream?: No Erection problems?: No Penile pain?: No Gastrointestinal Nausea?: No Vomiting?: No Indigestion/heartburn?: No Diarrhea?: No Constipation?: No Constitutional Fever: No Night sweats?: No Weight loss?: No Fatigue?: No Skin Skin rash/lesions?: No Itching?: No Eyes Blurred vision?: No Double vision?: No Ears/Nose/Throat Sore throat?: No Sinus problems?: No Hematologic/Lymphatic Swollen glands?: No Easy bruising?: No Cardiovascular Leg swelling?: No Chest pain?: No Respiratory Cough?: No Shortness of breath?: No Endocrine Excessive thirst?: No Musculoskeletal Back pain?: No Joint pain?: No Neurological Headaches?: No Dizziness?:  No Psychologic Depression?: No Anxiety?: No    Physical Exam: BP 132/85   Pulse 62   Ht 5\' 10"  (1.778 m)   Wt 219 lb (99.3 kg)   BMI 31.42 kg/m   Constitutional:  Alert and oriented, No acute distress. HEENT: [place] AT, moist mucus membranes.  Trachea midline, no masses. Cardiovascular: No clubbing, cyanosis, or edema. Respiratory: Normal respiratory effort, no increased work of breathing. GI: Abdomen is soft, nontender, nondistended, no abdominal masses GU: No CVA tenderness. Rectal: Normal sphincter tone, no tender, no nodules.  Mildly enlarged.  Neurologic: Grossly intact, no focal deficits, moving all 4 extremities. Psychiatric: Normal mood and affect.  Laboratory Data: Cr 1.2 on 04/16/16 --> up from 1.1  Alc 7.0 on 02/09/16  Pertinent Imaging: CLINICAL DATA:  Nephrolithiasis.  EXAM: ABDOMEN - 1 VIEW  COMPARISON:  05/30/2015  FINDINGS: Moderate stool throughout the colon.  This could obscure renal calculi. No definite renal calculi on today's study. Previously noted left renal calculus could be obscured by bowel gas and stool.  Mild lumbar degenerative change. Nonobstructive bowel gas pattern. Pelvic calcifications unchanged.  IMPRESSION: No renal calculi identified however there is stool and gas overlying both kidneys.   Electronically Signed   By: Franchot Gallo M.D.   On: 05/17/2016 16:21  KUB personally reviewed today and with the patient  Assessment & Plan:  70 yo with BPH, kidney stones s/p R ESWL/ URS, gross/ microscopic hematuria.  1. Kidney stones Previous left 7 mm stone not seen today- likely present but obstructed by gas Reminded of stone diet Declined Litholink RTC in 1 year KUB sooner if develops flank pain or any other worrisome symptoms.  2. BPH (benign prostatic hyperplasia) Voiding symptoms stable on finasteride/ flomax.  Continue these meds.    3. Prostate cancer screening PSA/DRE up-to-date.  Recommend annually at least  until age 60, will reeval at that time.   Return in about 1 year (around 05/21/2017) for KUB.  Hollice Espy, MD  [facility] [address] [place], [place] [address] [phone]

## 2016-05-28 ENCOUNTER — Ambulatory Visit: Payer: Medicare Other | Admitting: Urology

## 2016-07-07 ENCOUNTER — Encounter: Payer: Self-pay | Admitting: *Deleted

## 2016-07-08 ENCOUNTER — Ambulatory Visit: Payer: Medicare Other | Admitting: Anesthesiology

## 2016-07-08 ENCOUNTER — Encounter
Admission: RE | Disposition: A | Discharge status: Home / Self Care | Payer: Self-pay | Source: Ambulatory Visit | Attending: Gastroenterology

## 2016-07-08 ENCOUNTER — Ambulatory Visit
Admission: RE | Admit: 2016-07-08 | Discharge: 2016-07-08 | Disposition: A | Discharge status: Home / Self Care | Payer: Medicare Other | Source: Ambulatory Visit | Attending: Gastroenterology | Admitting: Gastroenterology

## 2016-07-08 ENCOUNTER — Encounter: Payer: Self-pay | Admitting: *Deleted

## 2016-07-08 DIAGNOSIS — Z79899 Other long term (current) drug therapy: Secondary | ICD-10-CM | POA: Diagnosis not present

## 2016-07-08 DIAGNOSIS — N289 Disorder of kidney and ureter, unspecified: Secondary | ICD-10-CM | POA: Diagnosis not present

## 2016-07-08 DIAGNOSIS — D123 Benign neoplasm of transverse colon: Secondary | ICD-10-CM | POA: Diagnosis not present

## 2016-07-08 DIAGNOSIS — I4891 Unspecified atrial fibrillation: Secondary | ICD-10-CM | POA: Insufficient documentation

## 2016-07-08 DIAGNOSIS — K640 First degree hemorrhoids: Secondary | ICD-10-CM | POA: Insufficient documentation

## 2016-07-08 DIAGNOSIS — Z85828 Personal history of other malignant neoplasm of skin: Secondary | ICD-10-CM | POA: Diagnosis not present

## 2016-07-08 DIAGNOSIS — E785 Hyperlipidemia, unspecified: Secondary | ICD-10-CM | POA: Diagnosis not present

## 2016-07-08 DIAGNOSIS — K921 Melena: Secondary | ICD-10-CM | POA: Diagnosis present

## 2016-07-08 DIAGNOSIS — Z888 Allergy status to other drugs, medicaments and biological substances status: Secondary | ICD-10-CM | POA: Diagnosis not present

## 2016-07-08 DIAGNOSIS — I10 Essential (primary) hypertension: Secondary | ICD-10-CM | POA: Diagnosis not present

## 2016-07-08 DIAGNOSIS — Z8711 Personal history of peptic ulcer disease: Secondary | ICD-10-CM | POA: Diagnosis not present

## 2016-07-08 DIAGNOSIS — K5730 Diverticulosis of large intestine without perforation or abscess without bleeding: Secondary | ICD-10-CM | POA: Insufficient documentation

## 2016-07-08 DIAGNOSIS — E118 Type 2 diabetes mellitus with unspecified complications: Secondary | ICD-10-CM | POA: Diagnosis not present

## 2016-07-08 DIAGNOSIS — Z7984 Long term (current) use of oral hypoglycemic drugs: Secondary | ICD-10-CM | POA: Insufficient documentation

## 2016-07-08 DIAGNOSIS — Z8719 Personal history of other diseases of the digestive system: Secondary | ICD-10-CM | POA: Diagnosis not present

## 2016-07-08 DIAGNOSIS — I2510 Atherosclerotic heart disease of native coronary artery without angina pectoris: Secondary | ICD-10-CM | POA: Diagnosis not present

## 2016-07-08 DIAGNOSIS — R1032 Left lower quadrant pain: Secondary | ICD-10-CM | POA: Insufficient documentation

## 2016-07-08 DIAGNOSIS — Z87442 Personal history of urinary calculi: Secondary | ICD-10-CM | POA: Diagnosis not present

## 2016-07-08 DIAGNOSIS — N400 Enlarged prostate without lower urinary tract symptoms: Secondary | ICD-10-CM | POA: Diagnosis not present

## 2016-07-08 DIAGNOSIS — E039 Hypothyroidism, unspecified: Secondary | ICD-10-CM | POA: Diagnosis not present

## 2016-07-08 DIAGNOSIS — K625 Hemorrhage of anus and rectum: Secondary | ICD-10-CM | POA: Insufficient documentation

## 2016-07-08 DIAGNOSIS — G629 Polyneuropathy, unspecified: Secondary | ICD-10-CM | POA: Diagnosis not present

## 2016-07-08 DIAGNOSIS — G4733 Obstructive sleep apnea (adult) (pediatric): Secondary | ICD-10-CM | POA: Diagnosis not present

## 2016-07-08 DIAGNOSIS — Z880 Allergy status to penicillin: Secondary | ICD-10-CM | POA: Diagnosis not present

## 2016-07-08 DIAGNOSIS — K219 Gastro-esophageal reflux disease without esophagitis: Secondary | ICD-10-CM | POA: Insufficient documentation

## 2016-07-08 HISTORY — PX: COLONOSCOPY WITH PROPOFOL: SHX5780

## 2016-07-08 LAB — GLUCOSE, CAPILLARY: GLUCOSE-CAPILLARY: 111 mg/dL — AB (ref 65–99)

## 2016-07-08 SURGERY — COLONOSCOPY WITH PROPOFOL
Anesthesia: General

## 2016-07-08 MED ORDER — PROPOFOL 500 MG/50ML IV EMUL
INTRAVENOUS | Status: AC
  Filled 2016-07-08: qty 50

## 2016-07-08 MED ORDER — LIDOCAINE HCL (PF) 2 % IJ SOLN
INTRAMUSCULAR | Status: AC
  Filled 2016-07-08: qty 2

## 2016-07-08 MED ORDER — SODIUM CHLORIDE 0.9 % IV SOLN: INTRAVENOUS | Status: DC

## 2016-07-08 MED ORDER — SODIUM CHLORIDE 0.9 % IV SOLN
INTRAVENOUS | Status: DC
  Administered 2016-07-08: 07:00:00 via INTRAVENOUS

## 2016-07-08 MED ORDER — PROPOFOL 10 MG/ML IV BOLUS
INTRAVENOUS | Status: DC | PRN
Start: 2016-07-08 — End: 2016-07-08
  Administered 2016-07-08: 47.2 mg via INTRAVENOUS

## 2016-07-08 MED ORDER — FENTANYL CITRATE (PF) 100 MCG/2ML IJ SOLN
INTRAMUSCULAR | Status: AC
  Filled 2016-07-08: qty 2

## 2016-07-08 MED ORDER — MIDAZOLAM HCL 5 MG/5ML IJ SOLN
INTRAMUSCULAR | Status: DC | PRN
Start: 2016-07-08 — End: 2016-07-08
  Administered 2016-07-08: 1 mg via INTRAVENOUS

## 2016-07-08 MED ORDER — PROPOFOL 500 MG/50ML IV EMUL
INTRAVENOUS | Status: DC | PRN
  Administered 2016-07-08: 120 ug/kg/min via INTRAVENOUS

## 2016-07-08 MED ORDER — FENTANYL CITRATE (PF) 100 MCG/2ML IJ SOLN
INTRAMUSCULAR | Status: DC | PRN
  Administered 2016-07-08: 50 ug via INTRAVENOUS

## 2016-07-08 MED ORDER — MIDAZOLAM HCL 2 MG/2ML IJ SOLN
INTRAMUSCULAR | Status: AC
  Filled 2016-07-08: qty 2

## 2016-07-08 MED ORDER — LACTATED RINGERS IV SOLN
INTRAVENOUS | Status: DC | PRN
  Administered 2016-07-08: 07:00:00 via INTRAVENOUS

## 2016-07-08 NOTE — Anesthesia Post-op Follow-up Note (Signed)
Anesthesia QCDR form completed.        

## 2016-07-08 NOTE — Op Note (Signed)
[facility] Gastroenterology Patient Name: Christopher [name] Procedure Date: 07/08/2016 7:29 AM MRN: [id number] Account #: [id number] Date of Birth: [birth year] Admit Type: Outpatient Age: 71 Room: [facility] ENDO ROOM 1 Gender: Male Note Status: Finalized Procedure:            Colonoscopy Indications:          Abdominal pain in the left lower quadrant, Rectal                        bleeding Providers:            [name], MD Referring MD:         Leonie Douglas. Doy Hutching, MD (Referring MD) Medicines:            Monitored Anesthesia Care Complications:        No immediate complications. Procedure:            Pre-Anesthesia Assessment:                       - ASA Grade Assessment: III - A patient with severe                        systemic disease.                       After obtaining informed consent, the colonoscope was                        passed under direct vision. Throughout the procedure,                        the patient's blood pressure, pulse, and oxygen                        saturations were monitored continuously. The                        Colonoscope was introduced through the anus and                        advanced to the the cecum, identified by appendiceal                        orifice and ileocecal valve. The colonoscopy was                        performed with moderate difficulty due to multiple                        diverticula in the colon and poor bowel prep.                        Successful completion of the procedure was aided by                        lavage. The quality of the bowel preparation was fair. Findings:      A 2 mm polyp was found in the hepatic flexure. The polyp was sessile.       The polyp was removed with a cold biopsy forceps. Resection and       retrieval were complete.  Many small and large-mouthed diverticula were found in the sigmoid       colon, descending colon, splenic flexure, transverse colon, hepatic   flexure and ascending colon.      Non-bleeding internal hemorrhoids were found during retroflexion and       during anoscopy. The hemorrhoids were small and Grade I (internal       hemorrhoids that do not prolapse).      The digital rectal exam was normal. Pertinent negatives include note       several small skin tags. Impression:           - Preparation of the colon was fair.                       - One 2 mm polyp at the hepatic flexure, removed with a                        cold biopsy forceps. Resected and retrieved.                       - Diverticulosis in the sigmoid colon, in the                        descending colon, at the splenic flexure, in the                        transverse colon, at the hepatic flexure and in the                        ascending colon.                       - Non-bleeding internal hemorrhoids. Recommendation:       - Discharge patient to home.                       - Await pathology results.                       - Telephone GI clinic for pathology results in 1 week. Procedure Code(s):    --- Professional ---                       [phone], Colonoscopy, flexible; with biopsy, single or                        multiple Diagnosis Code(s):    --- Professional ---                       K64.0, First degree hemorrhoids                       D12.3, Benign neoplasm of transverse colon (hepatic                        flexure or splenic flexure)                       R10.32, Left lower quadrant pain                       K62.5, Hemorrhage of anus and rectum  K57.30, Diverticulosis of large intestine without                        perforation or abscess without bleeding CPT copyright 2016 American Medical Association. All rights reserved. The codes documented in this report are preliminary and upon coder review may  be revised to meet current compliance requirements. [name], MD 07/08/2016 8:05:53 AM This report has been signed  electronically. Number of Addenda: 0 Note Initiated On: 07/08/2016 7:29 AM Scope Withdrawal Time: 0 hours 12 minutes 8 seconds  Total Procedure Duration: 0 hours 22 minutes 4 seconds       [facility]

## 2016-07-08 NOTE — Addendum Note (Signed)
Addendum  created 07/08/16 [id number] by Gunnar Fusi, MD   Charge Capture section accepted

## 2016-07-08 NOTE — Transfer of Care (Signed)
Immediate Anesthesia Transfer of Care Note  Patient: Christopher [name]  Procedure(s) Performed: Procedure(s): COLONOSCOPY WITH PROPOFOL (N/A)  Patient Location: PACU and Endoscopy Unit  Anesthesia Type:General  Level of Consciousness: sedated  Airway & Oxygen Therapy: Patient Spontanous Breathing  [name]-op Assessment: Report given to RN and [name] -op Vital signs reviewed and stable  [name] vital signs: stable  Last Vitals:  Vitals:   07/08/16 0807 07/08/16 0817  BP: 107/68 111/62  Pulse: 67 64  Resp: 14 13  Temp: 36.3 C     Last Pain:  Vitals:   07/08/16 0807  TempSrc: Tympanic         Complications: No apparent anesthesia complications

## 2016-07-08 NOTE — Anesthesia Preprocedure Evaluation (Signed)
Anesthesia Evaluation  Patient identified by MRN, date of birth, ID band Patient awake    Reviewed: Allergy & Precautions, NPO status , Patient's Chart, lab work & pertinent test results  History of Anesthesia Complications Negative for: history of anesthetic complications  Airway Mallampati: II       Dental   Pulmonary sleep apnea and Continuous Positive Airway Pressure Ventilation , former smoker,           Cardiovascular hypertension, Pt. on medications and Pt. on home beta blockers + CAD  + dysrhythmias (hx, not in afib currently) Atrial Fibrillation      Neuro/Psych  Neuromuscular disease (peripheral neuropathy)    GI/Hepatic GERD  Medicated and Controlled,  Endo/Other  diabetes, Type 2, Oral Hypoglycemic AgentsHypothyroidism   Renal/GU Renal InsufficiencyRenal disease     Musculoskeletal   Abdominal   Peds  Hematology   Anesthesia Other Findings   Reproductive/Obstetrics                             Anesthesia Physical Anesthesia Plan  ASA: III  Anesthesia Plan: General   Post-op Pain Management:    Induction: Intravenous  Airway Management Planned: Nasal Cannula  Additional Equipment:   Intra-op Plan:   Post-operative Plan:   Informed Consent: I have reviewed the patients History and Physical, chart, labs and discussed the procedure including the risks, benefits and alternatives for the proposed anesthesia with the patient or authorized representative who has indicated his/her understanding and acceptance.     Plan Discussed with:   Anesthesia Plan Comments:         Anesthesia Quick Evaluation

## 2016-07-08 NOTE — H&P (Signed)
Outpatient short stay form Pre-procedure 07/08/2016 7:24 AM [name] MD  Primary Physician: Dr. Fulton Reek  Reason for visit:  Colonoscopy  History of present illness:  Patient is a 71 year old male presenting today as above. He tolerated his prep well. He does take eliquis which she has held for over 48 hours. He has been having some chronic intermittent left lower quadrant abdominal pain and he has a known history of diverticulitis in the past. He's also had a gastric ulcer in the past. He does take a daily proton pump inhibitor. He takes no other aspirin product or blood thinning agent.    Current Facility-Administered Medications:  .  0.9 %  sodium chloride infusion, , Intravenous, Continuous, [name], MD .  0.9 %  sodium chloride infusion, , Intravenous, Continuous, [name], MD  Prescriptions Prior to Admission  Medication Sig Dispense Refill Last Dose  . amLODipine (NORVASC) 5 MG tablet TAKE 1 BY MOUTH DAILY   07/08/2016 at 0550  . atorvastatin (LIPITOR) 40 MG tablet Take 40 mg by mouth daily.   07/07/2016 at Unknown time  . canagliflozin (INVOKANA) 300 MG TABS tablet Take 300 mg by mouth daily before breakfast.    07/07/2016 at Unknown time  . carvedilol (COREG) 25 MG tablet Take 25 mg by mouth 2 (two) times daily with a meal.   07/08/2016 at 0550  . doxazosin (CARDURA) 4 MG tablet TAKE 1 BY MOUTH AT BEDTIME   07/07/2016 at Unknown time  . finasteride (PROSCAR) 5 MG tablet Take by mouth. Once daily   07/07/2016 at Unknown time  . flecainide (TAMBOCOR) 50 MG tablet Take 50 mg by mouth 2 (two) times daily.    07/08/2016 at 0550  . fluticasone (FLONASE) 50 MCG/ACT nasal spray USE 2 SPRAYS IN EACH NOSTRIL ONCE DAILY AT BEDTIME   07/07/2016 at Unknown time  . glipiZIDE (GLUCOTROL XL) 10 MG 24 hr tablet Take 10 mg by mouth 2 (two) times daily.    07/07/2016 at Unknown time  . levothyroxine (SYNTHROID, LEVOTHROID) 100 MCG tablet Take 100 mcg by mouth daily before breakfast.  Take on an empty stomach with a glass of water at least 30-60 minutes before breakfast.   07/07/2016 at Unknown time  . lisinopril (PRINIVIL,ZESTRIL) 20 MG tablet TAKE 1 BY MOUTH TWICE DAILY   07/07/2016 at Unknown time  . pantoprazole (PROTONIX) 40 MG tablet TAKE 1 BY MOUTH DAILY   07/07/2016 at Unknown time  . pioglitazone (ACTOS) 30 MG tablet Take by mouth.   07/07/2016 at Unknown time  . tamsulosin (FLOMAX) 0.4 MG CAPS capsule TAKE 1 BY MOUTH DAILY   07/07/2016 at Unknown time  . apixaban (ELIQUIS) 5 MG TABS tablet TAKE 1 TABLET BY MOUTH TWICE A DAY   07/05/2016  . glucose blood (ONE TOUCH ULTRA TEST) test strip TEST twice a day or as directed   Taking  . metoprolol succinate (TOPROL-XL) 25 MG 24 hr tablet Take by mouth.   Taking     Allergies  Allergen Reactions  . Etodolac     Other reaction(s): Other (See Comments) GI BLEED  . Penicillins Rash     Past Medical History:  Diagnosis Date  . Acid reflux   . Atrial fibrillation ([place])   . BPH (benign prostatic hyperplasia)   . CAD (coronary artery disease)   . DM (diabetes mellitus) ([place])   . ED (erectile dysfunction)   . Gross hematuria   . HLD (hyperlipidemia)   . HTN (  hypertension)   . Hydronephrosis   . Hypothyroidism   . Kidney stones   . OSA (obstructive sleep apnea)   . Over weight   . Paresthesia of foot   . Peripheral neuropathy ([place])   . Renal insufficiency   . Skin cancer   . Sleep apnea   . Thyroid disease     Review of systems:      Physical Exam    Heart and lungs: Regular rate and rhythm without rub or gallop lungs are bilaterally clear.    HEENT: Normocephalic atraumatic eyes are anicteric    Other:     Pertinant exam for procedure: Soft nontender nondistended bowel sounds positive normoactive.    Planned proceedures: Colonoscopy and indicated procedures. I have discussed the risks benefits and complications of procedures to include not limited to bleeding, infection, perforation and the risk of  sedation and the patient wishes to proceed.    [name], MD Gastroenterology 07/08/2016  7:24 AM

## 2016-07-08 NOTE — Anesthesia Postprocedure Evaluation (Signed)
Anesthesia Post Note  Patient: Christopher [name]  Procedure(s) Performed: Procedure(s) (LRB): COLONOSCOPY WITH PROPOFOL (N/A)  Patient location during evaluation: Endoscopy Anesthesia Type: General Level of consciousness: awake and alert Pain management: pain level controlled Vital Signs Assessment: post-procedure vital signs reviewed and stable Respiratory status: spontaneous breathing and respiratory function stable Cardiovascular status: stable Anesthetic complications: no     Last Vitals:  Vitals:   07/08/16 0807 07/08/16 0817  BP: 107/68 111/62  Pulse: 67 64  Resp: 14 13  Temp: 36.3 C     Last Pain:  Vitals:   07/08/16 0807  TempSrc: Tympanic                 Lillar Bianca K

## 2016-07-09 ENCOUNTER — Encounter: Payer: Self-pay | Admitting: Gastroenterology

## 2016-07-09 LAB — SURGICAL PATHOLOGY

## 2016-11-26 DIAGNOSIS — I6523 Occlusion and stenosis of bilateral carotid arteries: Secondary | ICD-10-CM | POA: Insufficient documentation

## 2017-05-16 ENCOUNTER — Ambulatory Visit
Admission: RE | Admit: 2017-05-16 | Discharge: 2017-05-16 | Disposition: A | Discharge status: Home / Self Care | Payer: Medicare Other | Source: Ambulatory Visit | Attending: Urology | Admitting: Urology

## 2017-05-16 ENCOUNTER — Other Ambulatory Visit: Payer: Self-pay

## 2017-05-16 DIAGNOSIS — N200 Calculus of kidney: Secondary | ICD-10-CM | POA: Diagnosis present

## 2017-05-20 ENCOUNTER — Ambulatory Visit: Payer: Medicare Other | Admitting: Urology

## 2017-06-03 ENCOUNTER — Encounter: Payer: Self-pay | Admitting: Urology

## 2017-06-03 ENCOUNTER — Other Ambulatory Visit
Admission: RE | Admit: 2017-06-03 | Discharge: 2017-06-03 | Disposition: A | Discharge status: Home / Self Care | Payer: Medicare Other | Source: Ambulatory Visit | Attending: Urology | Admitting: Urology

## 2017-06-03 ENCOUNTER — Ambulatory Visit (INDEPENDENT_AMBULATORY_CARE_PROVIDER_SITE_OTHER): Payer: Medicare Other | Admitting: Urology

## 2017-06-03 VITALS — BP 124/69 | HR 64 | Ht 69.0 in | Wt 218.0 lb

## 2017-06-03 DIAGNOSIS — N200 Calculus of kidney: Secondary | ICD-10-CM | POA: Diagnosis not present

## 2017-06-03 DIAGNOSIS — Z125 Encounter for screening for malignant neoplasm of prostate: Secondary | ICD-10-CM | POA: Diagnosis present

## 2017-06-03 DIAGNOSIS — N400 Enlarged prostate without lower urinary tract symptoms: Secondary | ICD-10-CM | POA: Diagnosis not present

## 2017-06-03 NOTE — Progress Notes (Signed)
3:23 PM  06/03/17  Christopher [name] 05-03-1946 [id number]  Referring provider: Idelle Crouch, MD [facility] [place], [place] [address]  Chief Complaint  Patient presents with  . Nephrolithiasis    1year w/KUB    HPI: 72 yo M with BPH, intermittent gross hematuria, history of kidney stones. He returns today for annual routine follow-up.  Kidney stones- Personal history of stones. Bilateral stones on CTU, R>L 05/2014 S/p RIGHT ESWL on 08/13/4006 complicated by steinstrasse requiring stent placement 06/30/2014. He underwent RIGHT ureteroscopy 07/10/14 to clear his residual stone fragment in his ureter.  Follow up renal ultrasound showed eventual resolution of right hydronephrosis.  Declined metabolic work up No kidney stone episodes over past year, no gross hematuria or flank pain Occasional left flank pain with golf swing, otherwise negative KUB today with stable R 7 mm stone, stable from 2016   Gross hematuria- Resolved, s/p work up including CT Urogram and cystoscopy 04/2014  BPH- Voiding well, no complaints today PSA 0.28 on 04/16/16, due for rectal today and repeat PSA On finasteride/ flomax   PMH: Past Medical History:  Diagnosis Date  . Acid reflux   . Atrial fibrillation ([place])   . BPH (benign prostatic hyperplasia)   . CAD (coronary artery disease)   . DM (diabetes mellitus) ([place])   . ED (erectile dysfunction)   . Gross hematuria   . HLD (hyperlipidemia)   . HTN (hypertension)   . Hydronephrosis   . Hypothyroidism   . Kidney stones   . OSA (obstructive sleep apnea)   . Over weight   . Paresthesia of foot   . Peripheral neuropathy   . Renal insufficiency   . Skin cancer   . Sleep apnea   . Thyroid disease     Surgical History: Past Surgical History:  Procedure Laterality Date  . CARDIAC CATHETERIZATION    . CATARACT EXTRACTION     both eyes sperate times  . COLONOSCOPY N/A 03/26/2016   Procedure: COLONOSCOPY;   Surgeon: [name], MD;  Location: [facility] ENDOSCOPY;  Service: Endoscopy;  Laterality: N/A;  . COLONOSCOPY WITH PROPOFOL N/A 07/08/2016   Procedure: COLONOSCOPY WITH PROPOFOL;  Surgeon: [name], MD;  Location: [facility] ENDOSCOPY;  Service: Endoscopy;  Laterality: N/A;  . ESOPHAGOGASTRODUODENOSCOPY (EGD) WITH PROPOFOL N/A 03/26/2016   Procedure: ESOPHAGOGASTRODUODENOSCOPY (EGD) WITH PROPOFOL;  Surgeon: [name], MD;  Location: [facility] ENDOSCOPY;  Service: Endoscopy;  Laterality: N/A;  Diabetic  . HERNIA REPAIR    . NASAL SINUS SURGERY    . TONSILLECTOMY      Home Medications:  Allergies as of 06/03/2017      Reactions   Etodolac    Other reaction(s): Other (See Comments) GI BLEED   Penicillins Rash      Medication List        Accurate as of 06/03/17  3:23 PM. Always use your most recent med list.          amLODipine 5 MG tablet Commonly known as:  NORVASC TAKE 1 BY MOUTH DAILY   atorvastatin 40 MG tablet Commonly known as:  LIPITOR Take 40 mg by mouth daily.   carvedilol 25 MG tablet Commonly known as:  COREG Take 25 mg by mouth 2 (two) times daily with a meal.   doxazosin 4 MG tablet Commonly known as:  CARDURA TAKE 1 BY MOUTH AT BEDTIME   ELIQUIS 5 MG Tabs tablet Generic drug:  apixaban TAKE 1 TABLET BY  MOUTH TWICE A DAY   finasteride 5 MG tablet Commonly known as:  PROSCAR Take by mouth. Once daily   flecainide 50 MG tablet Commonly known as:  TAMBOCOR Take 50 mg by mouth 2 (two) times daily.   fluticasone 50 MCG/ACT nasal spray Commonly known as:  FLONASE USE 2 SPRAYS IN EACH NOSTRIL ONCE DAILY AT BEDTIME   glipiZIDE 10 MG 24 hr tablet Commonly known as:  GLUCOTROL XL Take 10 mg by mouth 2 (two) times daily.   INVOKANA 300 MG Tabs tablet Generic drug:  canagliflozin Take 300 mg by mouth daily before breakfast.   levothyroxine 100 MCG tablet Commonly known as:  SYNTHROID, LEVOTHROID Take 100 mcg by mouth daily before breakfast. Take  on an empty stomach with a glass of water at least 30-60 minutes before breakfast.   lisinopril 20 MG tablet Commonly known as:  PRINIVIL,ZESTRIL TAKE 1 BY MOUTH TWICE DAILY   metoprolol succinate 25 MG 24 hr tablet Commonly known as:  TOPROL-XL Take by mouth.   ONE TOUCH ULTRA TEST test strip Generic drug:  glucose blood TEST twice a day or as directed   pantoprazole 40 MG tablet Commonly known as:  PROTONIX TAKE 1 BY MOUTH DAILY   pioglitazone 30 MG tablet Commonly known as:  ACTOS Take by mouth.   tamsulosin 0.4 MG Caps capsule Commonly known as:  FLOMAX TAKE 1 BY MOUTH DAILY       Allergies:  Allergies  Allergen Reactions  . Etodolac     Other reaction(s): Other (See Comments) GI BLEED  . Penicillins Rash    Family History: Family History  Problem Relation Age of Onset  . Stroke Mother   . Heart attack Mother   . Heart attack Father   . Lung cancer Father   . Prostate cancer Neg Hx   . Kidney cancer Neg Hx   . Bladder Cancer Neg Hx     Social History:  reports that he quit smoking about 31 years ago. His smoking use included cigarettes. He smoked 2.00 packs per day. he has never used smokeless tobacco. He reports that he drinks alcohol. He reports that he does not use drugs.  ROS: UROLOGY Frequent Urination?: Yes Hard to postpone urination?: No Burning/pain with urination?: No Get up at night to urinate?: No Leakage of urine?: No Urine stream starts and stops?: No Trouble starting stream?: No Do you have to strain to urinate?: No Blood in urine?: No Urinary tract infection?: No Sexually transmitted disease?: No Injury to kidneys or bladder?: No Painful intercourse?: No Weak stream?: No Erection problems?: No Penile pain?: No Gastrointestinal Nausea?: No Vomiting?: No Indigestion/heartburn?: No Diarrhea?: No Constipation?: No Constitutional Fever: No Night sweats?: No Weight loss?: No Fatigue?: No Skin Skin rash/lesions?:  No Itching?: No Eyes Blurred vision?: No Double vision?: No Ears/Nose/Throat Sore throat?: No Sinus problems?: No Hematologic/Lymphatic Swollen glands?: No Easy bruising?: No Cardiovascular Leg swelling?: No Chest pain?: No Respiratory Cough?: Yes Shortness of breath?: No Endocrine Excessive thirst?: No Musculoskeletal Back pain?: Yes Joint pain?: No Neurological Headaches?: No Dizziness?: No Psychologic Depression?: No Anxiety?: No   Physical Exam: BP 124/69   Pulse 64   Ht 5\' 9"  (1.753 m)   Wt 218 lb (98.9 kg)   BMI 32.19 kg/m   Physical Exam  Constitutional: He is oriented to person, place, and time. He appears well-developed and well-nourished.  HENT:  Head: Normocephalic and atraumatic.  Pulmonary/Chest: Effort normal. No respiratory distress.  Abdominal: Soft. He  exhibits no distension. There is no tenderness.  Genitourinary: Rectal exam shows anal tone normal. Prostate is not enlarged.  Genitourinary Comments: No prostate nodules.    Neurological: He is alert and oriented to person, place, and time.  Skin: Skin is warm and dry.  Psychiatric: He has a normal mood and affect.    Laboratory Data: Cr 1.2 on 04/16/16 --> up from 1.1  Alc 7.0 on 02/09/16  Pertinent Imaging: CLINICAL DATA:  Follow-up urinary calculi.  EXAM: ABDOMEN - 1 VIEW  COMPARISON:  05/17/2016 radiographs  FINDINGS: A 7 mm radiopaque calculus overlying the left renal shadow is again noted.  No other urinary calculi are identified.  The bowel gas pattern is unremarkable.  IMPRESSION: Unchanged 7 mm left lower pole renal calculus.  No other radiopaque urinary calculi identified.   Electronically Signed   By: Margarette Canada M.D.   On: 05/16/2017 13:37   KUB personally reviewed today and with the patient  Assessment & Plan:  72 yo with BPH, kidney stones, history of gross/ microscopic hematuria.  1. Kidney stones Reminded of stone diet Declined Litholink  as in past RTC in 2 year KUB sooner if develops flank pain or any other worrisome symptoms  2. BPH (benign prostatic hyperplasia) Voiding symptoms stable on finasteride/ flomax.  Continue these meds.    3. Prostate cancer screening PSA/DRE up-to-date, PSA today pending, will call with results Recommend annually at least until age 75 Will have annual screening with PCP next year   F/u in 2 years with KUB, PSA screening next year by PCP  Hollice Espy, MD  [place], [place] [address] [phone]  I spent 25 min with this patient of which greater than 50% was spent in counseling and coordination of care with the patient.

## 2017-06-04 LAB — PSA: Prostatic Specific Antigen: 0.26 ng/mL (ref 0.00–4.00)

## 2017-06-06 ENCOUNTER — Telehealth: Payer: Self-pay

## 2017-06-06 NOTE — Telephone Encounter (Signed)
Letter sent.

## 2017-06-06 NOTE — Telephone Encounter (Signed)
-----   Message from Hollice Espy, MD sent at 06/04/2017  8:48 AM EST ----- PSA looks great.    Hollice Espy, MD

## 2017-09-06 ENCOUNTER — Ambulatory Visit: Payer: Medicare Other | Admitting: Certified Registered"

## 2017-09-06 ENCOUNTER — Encounter
Admission: RE | Disposition: A | Discharge status: Home / Self Care | Payer: Self-pay | Source: Ambulatory Visit | Attending: Gastroenterology

## 2017-09-06 ENCOUNTER — Ambulatory Visit
Admission: RE | Admit: 2017-09-06 | Discharge: 2017-09-06 | Disposition: A | Discharge status: Home / Self Care | Payer: Medicare Other | Source: Ambulatory Visit | Attending: Gastroenterology | Admitting: Gastroenterology

## 2017-09-06 ENCOUNTER — Other Ambulatory Visit: Payer: Self-pay

## 2017-09-06 ENCOUNTER — Encounter: Payer: Self-pay | Admitting: Anesthesiology

## 2017-09-06 DIAGNOSIS — G4733 Obstructive sleep apnea (adult) (pediatric): Secondary | ICD-10-CM | POA: Diagnosis not present

## 2017-09-06 DIAGNOSIS — K219 Gastro-esophageal reflux disease without esophagitis: Secondary | ICD-10-CM | POA: Insufficient documentation

## 2017-09-06 DIAGNOSIS — G4730 Sleep apnea, unspecified: Secondary | ICD-10-CM | POA: Insufficient documentation

## 2017-09-06 DIAGNOSIS — E119 Type 2 diabetes mellitus without complications: Secondary | ICD-10-CM | POA: Diagnosis not present

## 2017-09-06 DIAGNOSIS — K2270 Barrett's esophagus without dysplasia: Secondary | ICD-10-CM | POA: Insufficient documentation

## 2017-09-06 DIAGNOSIS — I10 Essential (primary) hypertension: Secondary | ICD-10-CM | POA: Insufficient documentation

## 2017-09-06 DIAGNOSIS — K317 Polyp of stomach and duodenum: Secondary | ICD-10-CM | POA: Insufficient documentation

## 2017-09-06 DIAGNOSIS — Z7901 Long term (current) use of anticoagulants: Secondary | ICD-10-CM | POA: Diagnosis not present

## 2017-09-06 DIAGNOSIS — E1142 Type 2 diabetes mellitus with diabetic polyneuropathy: Secondary | ICD-10-CM | POA: Insufficient documentation

## 2017-09-06 DIAGNOSIS — N400 Enlarged prostate without lower urinary tract symptoms: Secondary | ICD-10-CM | POA: Insufficient documentation

## 2017-09-06 DIAGNOSIS — Z7951 Long term (current) use of inhaled steroids: Secondary | ICD-10-CM | POA: Diagnosis not present

## 2017-09-06 DIAGNOSIS — Z79899 Other long term (current) drug therapy: Secondary | ICD-10-CM | POA: Diagnosis not present

## 2017-09-06 DIAGNOSIS — E785 Hyperlipidemia, unspecified: Secondary | ICD-10-CM | POA: Diagnosis not present

## 2017-09-06 DIAGNOSIS — E039 Hypothyroidism, unspecified: Secondary | ICD-10-CM | POA: Diagnosis not present

## 2017-09-06 DIAGNOSIS — Z87891 Personal history of nicotine dependence: Secondary | ICD-10-CM | POA: Diagnosis not present

## 2017-09-06 DIAGNOSIS — I2510 Atherosclerotic heart disease of native coronary artery without angina pectoris: Secondary | ICD-10-CM | POA: Insufficient documentation

## 2017-09-06 DIAGNOSIS — K449 Diaphragmatic hernia without obstruction or gangrene: Secondary | ICD-10-CM | POA: Diagnosis not present

## 2017-09-06 DIAGNOSIS — Z7984 Long term (current) use of oral hypoglycemic drugs: Secondary | ICD-10-CM | POA: Diagnosis not present

## 2017-09-06 DIAGNOSIS — Z85828 Personal history of other malignant neoplasm of skin: Secondary | ICD-10-CM | POA: Diagnosis not present

## 2017-09-06 DIAGNOSIS — K319 Disease of stomach and duodenum, unspecified: Secondary | ICD-10-CM | POA: Insufficient documentation

## 2017-09-06 DIAGNOSIS — I4891 Unspecified atrial fibrillation: Secondary | ICD-10-CM | POA: Diagnosis not present

## 2017-09-06 DIAGNOSIS — Z79890 Hormone replacement therapy (postmenopausal): Secondary | ICD-10-CM | POA: Insufficient documentation

## 2017-09-06 HISTORY — PX: ESOPHAGOGASTRODUODENOSCOPY (EGD) WITH PROPOFOL: SHX5813

## 2017-09-06 HISTORY — DX: Unspecified osteoarthritis, unspecified site: M19.90

## 2017-09-06 HISTORY — DX: Cardiac arrhythmia, unspecified: I49.9

## 2017-09-06 HISTORY — DX: Personal history of urinary calculi: Z87.442

## 2017-09-06 HISTORY — DX: Benign neoplasm of colon, unspecified: D12.6

## 2017-09-06 HISTORY — DX: Other intervertebral disc displacement, lumbar region: M51.26

## 2017-09-06 HISTORY — DX: Diverticulosis of intestine, part unspecified, without perforation or abscess without bleeding: K57.90

## 2017-09-06 LAB — GLUCOSE, CAPILLARY: Glucose-Capillary: 120 mg/dL — ABNORMAL HIGH (ref 65–99)

## 2017-09-06 SURGERY — ESOPHAGOGASTRODUODENOSCOPY (EGD) WITH PROPOFOL
Anesthesia: General

## 2017-09-06 MED ORDER — SODIUM CHLORIDE 0.9 % IV SOLN: INTRAVENOUS | Status: DC

## 2017-09-06 MED ORDER — PROPOFOL 500 MG/50ML IV EMUL
INTRAVENOUS | Status: DC | PRN
  Administered 2017-09-06: 200 ug/kg/min via INTRAVENOUS

## 2017-09-06 MED ORDER — LIDOCAINE HCL (CARDIAC) 20 MG/ML IV SOLN
INTRAVENOUS | Status: DC | PRN
  Administered 2017-09-06: 100 mg via INTRAVENOUS

## 2017-09-06 MED ORDER — SODIUM CHLORIDE 0.9 % IV SOLN
INTRAVENOUS | Status: DC
  Administered 2017-09-06: 07:00:00 via INTRAVENOUS

## 2017-09-06 MED ORDER — PROPOFOL 10 MG/ML IV BOLUS
INTRAVENOUS | Status: AC
  Filled 2017-09-06: qty 20

## 2017-09-06 MED ORDER — LIDOCAINE HCL (PF) 2 % IJ SOLN
INTRAMUSCULAR | Status: AC
  Filled 2017-09-06: qty 10

## 2017-09-06 NOTE — OR Nursing (Signed)
Asked Dr. Gustavo Lah when pt is to restart his Eliquis, he instructed pt to restart this med tonight.

## 2017-09-06 NOTE — Anesthesia Preprocedure Evaluation (Signed)
Anesthesia Evaluation  Patient identified by MRN, date of birth, ID band Patient awake    Reviewed: Allergy & Precautions, NPO status , Patient's Chart, lab work & pertinent test results, reviewed documented beta blocker date and time   Airway Mallampati: III  TM Distance: >3 FB     Dental  (+) Chipped   Pulmonary sleep apnea and Continuous Positive Airway Pressure Ventilation , former smoker,           Cardiovascular hypertension, Pt. on medications and Pt. on home beta blockers + CAD  + dysrhythmias Atrial Fibrillation      Neuro/Psych  Neuromuscular disease    GI/Hepatic GERD  ,  Endo/Other  diabetes, Type 2Hypothyroidism   Renal/GU Renal disease     Musculoskeletal  (+) Arthritis ,   Abdominal   Peds  Hematology   Anesthesia Other Findings   Reproductive/Obstetrics                             Anesthesia Physical Anesthesia Plan  ASA: III  Anesthesia Plan: General   Post-op Pain Management:    Induction: Intravenous  PONV Risk Score and Plan:   Airway Management Planned:   Additional Equipment:   Intra-op Plan:   Post-operative Plan:   Informed Consent: I have reviewed the patients History and Physical, chart, labs and discussed the procedure including the risks, benefits and alternatives for the proposed anesthesia with the patient or authorized representative who has indicated his/her understanding and acceptance.     Plan Discussed with: CRNA  Anesthesia Plan Comments:         Anesthesia Quick Evaluation

## 2017-09-06 NOTE — Transfer of Care (Signed)
Immediate Anesthesia Transfer of Care Note  Patient: Christopher [name]  Procedure(s) Performed: ESOPHAGOGASTRODUODENOSCOPY (EGD) WITH PROPOFOL (N/A )  Patient Location: PACU and Endoscopy Unit  Anesthesia Type:General  Level of Consciousness: alert   Airway & Oxygen Therapy: Patient Spontanous Breathing  [name]-op Assessment: Report given to RN  [name] vital signs: stable  Last Vitals:  Vitals Value Taken Time  BP 113/74 09/06/2017  8:01 AM  Temp 36.7 C 09/06/2017  8:06 AM  Pulse 58 09/06/2017  8:08 AM  Resp 19 09/06/2017  8:08 AM  SpO2 97 % 09/06/2017  8:08 AM  Vitals shown include unvalidated device data.  Last Pain:  Vitals:   09/06/17 0806  TempSrc: Tympanic  PainSc:          Complications: No apparent anesthesia complications

## 2017-09-06 NOTE — Op Note (Signed)
[facility] Gastroenterology Patient Name: Christopher [name] Procedure Date: 09/06/2017 7:34 AM MRN: [id number] Account #: [id number] Date of Birth: [birth year] Admit Type: Outpatient Age: 72 Room: [facility] ENDO ROOM 2 Gender: Male Note Status: Finalized Procedure:            Upper GI endoscopy Indications:          Follow-up of Barrett's esophagus Providers:            [name], MD Referring MD:         Leonie Douglas. Doy Hutching, MD (Referring MD) Medicines:            Monitored Anesthesia Care Complications:        No immediate complications. Procedure:            Pre-Anesthesia Assessment:                       - ASA Grade Assessment: III - A patient with severe                        systemic disease.                       After obtaining informed consent, the endoscope was                        passed under direct vision. Throughout the procedure,                        the patient's blood pressure, pulse, and oxygen                        saturations were monitored continuously. The Endoscope                        was introduced through the mouth, and advanced to the                        third part of duodenum. The upper GI endoscopy was                        accomplished without difficulty. The patient tolerated                        the procedure well. Findings:      There were esophageal mucosal changes secondary to established       short-segment Barrett's disease present in the distal esophagus. The       maximum longitudinal extent of these mucosal changes was 1 cm in length.       Mucosa was biopsied with a cold forceps for histology in 4 quadrants.       One specimen bottle was sent to pathology.      The exam of the esophagus was otherwise normal.      Patchy mild inflammation characterized by erythema was found in the       gastric body. Biopsies were taken with a cold forceps for histology.       Biopsies were taken with a cold forceps for  Helicobacter pylori testing.      A single 3 mm sessile polyp with no bleeding and no stigmata of recent       bleeding  was found on the anterior wall of the gastric body. The polyp       was removed with a cold biopsy forceps. Resection and retrieval were       complete.      A single 2 mm sessile polyp with no bleeding and no stigmata of recent       bleeding was found in the cardia. The polyp was removed with a cold       biopsy forceps. Resection and retrieval were complete.      The cardia and gastric fundus were normal on retroflexion otherwise.      A small hiatal hernia was found. The Z-line was a variable distance from       incisors; the hiatal hernia was sliding.      Patchy minimal inflammation characterized by erythema was found in the       duodenal bulb. Impression:           - Esophageal mucosal changes secondary to established                        short-segment Barrett's disease. Biopsied.                       - Gastritis. Biopsied.                       - A single gastric polyp. Resected and retrieved.                       - A single gastric polyp. Resected and retrieved.                       - Small hiatal hernia.                       - Normal examined duodenum. Recommendation:       - Await pathology results.                       - Use Protonix (pantoprazole) 40 mg PO daily daily. Procedure Code(s):    --- Professional ---                       [phone], Esophagogastroduodenoscopy, flexible, transoral;                        with biopsy, single or multiple Diagnosis Code(s):    --- Professional ---                       K22.70, Barrett's esophagus without dysplasia                       K29.70, Gastritis, unspecified, without bleeding                       K31.7, Polyp of stomach and duodenum                       K44.9, Diaphragmatic hernia without obstruction or                        gangrene CPT copyright 2017 [employer]. All rights  reserved. The codes documented in this report are  preliminary and upon coder review may  be revised to meet current compliance requirements. [name], MD 09/06/2017 8:00:27 AM This report has been signed electronically. Number of Addenda: 0 Note Initiated On: 09/06/2017 7:34 AM      [facility]

## 2017-09-06 NOTE — H&P (Signed)
Outpatient short stay form Pre-procedure 09/06/2017 7:26 AM Christopher Sails MD  Primary Physician: Dr. Fulton Reek  Reason for visit: EGD  History of present illness: Patient is a 72 year old male presenting today as above.  He has a personal history of Barrett's esophagus with his last EGD being done 03/26/2016.  He is presenting today for a recheck.  His Barrett's esophagus was a new diagnosis at that time.  He has been on a PPI daily.  He does take eliquis daily but has held it for over 48 hours.  He takes no other aspirin products or blood thinning agent.    Current Facility-Administered Medications:  .  0.9 %  sodium chloride infusion, , Intravenous, Continuous, Christopher Sails, MD .  0.9 %  sodium chloride infusion, , Intravenous, Continuous, Christopher Sails, MD  Medications Prior to Admission  Medication Sig Dispense Refill Last Dose  . amLODipine (NORVASC) 5 MG tablet TAKE 1 BY MOUTH DAILY   09/06/2017 at 0620  . apixaban (ELIQUIS) 5 MG TABS tablet TAKE 1 TABLET BY MOUTH TWICE A DAY   09/03/2017 at Unknown time  . atorvastatin (LIPITOR) 40 MG tablet Take 40 mg by mouth daily.   09/05/2017 at 2200  . carvedilol (COREG) 25 MG tablet Take 25 mg by mouth 2 (two) times daily with a meal.   09/06/2017 at 0620  . doxazosin (CARDURA) 4 MG tablet TAKE 1 BY MOUTH AT BEDTIME   09/05/2017 at 2200  . empagliflozin (JARDIANCE) 25 MG TABS tablet Take 25 mg by mouth daily.   09/05/2017 at 0900  . finasteride (PROSCAR) 5 MG tablet Take by mouth. Once daily   09/06/2017 at 0620  . fluticasone (FLONASE) 50 MCG/ACT nasal spray USE 2 SPRAYS IN EACH NOSTRIL ONCE DAILY AT BEDTIME   Past Week at Unknown time  . glipiZIDE (GLUCOTROL XL) 10 MG 24 hr tablet Take 10 mg by mouth 2 (two) times daily.    09/05/2017 at 2200  . glucose blood (ONE TOUCH ULTRA TEST) test strip TEST twice a day or as directed   09/05/2017 at Unknown time  . levothyroxine (SYNTHROID, LEVOTHROID) 100 MCG tablet Take 100 mcg by mouth daily  before breakfast. Take on an empty stomach with a glass of water at least 30-60 minutes before breakfast.   09/06/2017 at 0620  . lisinopril (PRINIVIL,ZESTRIL) 20 MG tablet TAKE 1 BY MOUTH TWICE DAILY   09/06/2017 at 0620  . pantoprazole (PROTONIX) 40 MG tablet TAKE 1 BY MOUTH DAILY   09/06/2017 at 0620  . pioglitazone (ACTOS) 30 MG tablet Take by mouth.   09/05/2017 at 2200  . tamsulosin (FLOMAX) 0.4 MG CAPS capsule TAKE 1 BY MOUTH DAILY   09/05/2017 at 2200  . canagliflozin (INVOKANA) 300 MG TABS tablet Take 300 mg by mouth daily before breakfast.    Completed Course at Unknown time  . flecainide (TAMBOCOR) 50 MG tablet Take 50 mg by mouth 2 (two) times daily.    07/08/2016 at 0550  . metoprolol succinate (TOPROL-XL) 25 MG 24 hr tablet Take by mouth.   Taking     Allergies  Allergen Reactions  . Etodolac     Other reaction(s): Other (See Comments) GI BLEED  . Penicillins Rash     Past Medical History:  Diagnosis Date  . Acid reflux   . Arthritis   . Atrial fibrillation ([place])   . BPH (benign prostatic hyperplasia)   . CAD (coronary artery disease)   . Colon adenomas   .  Disc displacement, lumbar   . Diverticulosis   . DM (diabetes mellitus) ([place])   . Dysrhythmia   . ED (erectile dysfunction)   . Gross hematuria   . History of kidney stones   . HLD (hyperlipidemia)   . HTN (hypertension)   . Hydronephrosis   . Hypothyroidism   . Kidney stones   . OSA (obstructive sleep apnea)   . Over weight   . Paresthesia of foot   . Peripheral neuropathy   . Peripheral neuropathy   . Renal insufficiency   . Skin cancer   . Sleep apnea   . Thyroid disease     Review of systems:      Physical Exam    Heart and lungs: Regular rate and rhythm without rub or gallop, lungs are bilaterally clear.    HEENT: Normocephalic atraumatic eyes are anicteric    Other:    Pertinant exam for procedure: Soft nontender nondistended bowel sounds positive normoactive.    Planned proceedures: EGD  and indicated procedures. I have discussed the risks benefits and complications of procedures to include not limited to bleeding, infection, perforation and the risk of sedation and the patient wishes to proceed.    Christopher Sails, MD Gastroenterology 09/06/2017  7:26 AM

## 2017-09-06 NOTE — Anesthesia Postprocedure Evaluation (Signed)
Anesthesia Post Note  Patient: Christopher [name]  Procedure(s) Performed: ESOPHAGOGASTRODUODENOSCOPY (EGD) WITH PROPOFOL (N/A )  Patient location during evaluation: Endoscopy Anesthesia Type: General Level of consciousness: awake and alert Pain management: pain level controlled Vital Signs Assessment: post-procedure vital signs reviewed and stable Respiratory status: spontaneous breathing, nonlabored ventilation, respiratory function stable and patient connected to nasal cannula oxygen Cardiovascular status: blood pressure returned to baseline and stable Postop Assessment: no apparent nausea or vomiting Anesthetic complications: no     Last Vitals:  Vitals:   09/06/17 0811 09/06/17 0831  BP: 108/66 (!) 143/82  Pulse: (!) 58 (!) 57  Resp: 19 15  Temp:    SpO2: 94% 98%    Last Pain:  Vitals:   09/06/17 0831  TempSrc:   PainSc: 0-No pain                 Trase Bunda S

## 2017-09-06 NOTE — Anesthesia Post-op Follow-up Note (Signed)
Anesthesia QCDR form completed.        

## 2017-09-07 LAB — SURGICAL PATHOLOGY

## 2017-09-08 ENCOUNTER — Encounter: Payer: Self-pay | Admitting: Gastroenterology

## 2018-01-31 ENCOUNTER — Other Ambulatory Visit: Payer: Self-pay | Admitting: Internal Medicine

## 2018-01-31 ENCOUNTER — Other Ambulatory Visit: Payer: Self-pay | Admitting: Pediatrics

## 2018-01-31 DIAGNOSIS — R9389 Abnormal findings on diagnostic imaging of other specified body structures: Secondary | ICD-10-CM

## 2018-02-01 ENCOUNTER — Ambulatory Visit: Admission: RE | Admit: 2018-02-01 | Payer: Medicare Other | Source: Ambulatory Visit

## 2018-02-06 ENCOUNTER — Ambulatory Visit
Admission: RE | Admit: 2018-02-06 | Discharge: 2018-02-06 | Disposition: A | Discharge status: Home / Self Care | Payer: Medicare Other | Source: Ambulatory Visit | Attending: Pediatrics | Admitting: Pediatrics

## 2018-02-06 DIAGNOSIS — I700 Atherosclerosis of aorta: Secondary | ICD-10-CM | POA: Insufficient documentation

## 2018-02-06 DIAGNOSIS — R9389 Abnormal findings on diagnostic imaging of other specified body structures: Secondary | ICD-10-CM | POA: Diagnosis not present

## 2018-02-06 DIAGNOSIS — R918 Other nonspecific abnormal finding of lung field: Secondary | ICD-10-CM | POA: Insufficient documentation

## 2018-02-06 MED ORDER — IOPAMIDOL (ISOVUE-300) INJECTION 61%
75.0000 mL | Freq: Once | INTRAVENOUS | Status: AC | PRN
  Administered 2018-02-06: 75 mL via INTRAVENOUS

## 2018-02-10 ENCOUNTER — Other Ambulatory Visit: Payer: Self-pay | Admitting: Internal Medicine

## 2018-02-10 DIAGNOSIS — R911 Solitary pulmonary nodule: Secondary | ICD-10-CM

## 2018-02-15 ENCOUNTER — Ambulatory Visit
Admission: RE | Admit: 2018-02-15 | Discharge: 2018-02-15 | Disposition: A | Discharge status: Home / Self Care | Payer: Medicare Other | Source: Ambulatory Visit | Attending: Internal Medicine | Admitting: Internal Medicine

## 2018-02-15 DIAGNOSIS — I2510 Atherosclerotic heart disease of native coronary artery without angina pectoris: Secondary | ICD-10-CM | POA: Insufficient documentation

## 2018-02-15 DIAGNOSIS — I700 Atherosclerosis of aorta: Secondary | ICD-10-CM | POA: Diagnosis not present

## 2018-02-15 DIAGNOSIS — E279 Disorder of adrenal gland, unspecified: Secondary | ICD-10-CM | POA: Insufficient documentation

## 2018-02-15 DIAGNOSIS — R911 Solitary pulmonary nodule: Secondary | ICD-10-CM | POA: Diagnosis present

## 2018-02-15 DIAGNOSIS — R918 Other nonspecific abnormal finding of lung field: Secondary | ICD-10-CM | POA: Insufficient documentation

## 2018-02-15 DIAGNOSIS — N200 Calculus of kidney: Secondary | ICD-10-CM | POA: Insufficient documentation

## 2018-02-15 LAB — GLUCOSE, CAPILLARY: Glucose-Capillary: 127 mg/dL — ABNORMAL HIGH (ref 70–99)

## 2018-02-15 MED ORDER — FLUDEOXYGLUCOSE F - 18 (FDG) INJECTION
11.1000 | Freq: Once | INTRAVENOUS | Status: AC
  Administered 2018-02-15: 11.1 via INTRAVENOUS

## 2018-02-17 ENCOUNTER — Other Ambulatory Visit: Payer: Self-pay

## 2018-02-17 ENCOUNTER — Inpatient Hospital Stay: Payer: Medicare Other | Attending: Oncology | Admitting: Oncology

## 2018-02-17 ENCOUNTER — Encounter: Payer: Self-pay | Admitting: *Deleted

## 2018-02-17 VITALS — BP 149/75 | HR 57 | Temp 97.8°F | Wt 220.0 lb

## 2018-02-17 DIAGNOSIS — I10 Essential (primary) hypertension: Secondary | ICD-10-CM | POA: Diagnosis not present

## 2018-02-17 DIAGNOSIS — Z87891 Personal history of nicotine dependence: Secondary | ICD-10-CM | POA: Diagnosis not present

## 2018-02-17 DIAGNOSIS — R911 Solitary pulmonary nodule: Secondary | ICD-10-CM | POA: Diagnosis present

## 2018-02-17 DIAGNOSIS — E119 Type 2 diabetes mellitus without complications: Secondary | ICD-10-CM | POA: Insufficient documentation

## 2018-02-17 NOTE — Progress Notes (Signed)
Patient here today for new evaluation regarding lung nodule.

## 2018-02-17 NOTE — Progress Notes (Signed)
[facility]  [phone] [phone]  ID: Christopher [name] OB: [birth year]  MR#: [id number]  WOE#:[id number]  Patient Care Team: Idelle Crouch, MD as PCP - General (Internal Medicine) Telford Nab, RN as Registered Nurse  CHIEF COMPLAINT: PET positive pulmonary nodule.  INTERVAL HISTORY: Patient is a 71 year old male who recently had a chest x-ray for rib pain.  No etiology was determined for his pain, he was noted to have an abnormality in his left upper lobe.  Subsequent CT and PET scan revealed 2 distinct lesions in the left upper lobe that were PET positive.  Currently, he feels well and is asymptomatic.  He has no further pain.  He has no neurologic complaints.  He denies any recent fevers or illnesses.  He has a good appetite and denies weight loss.  He has no chest pain, cough, hemoptysis, or shortness of breath.  He denies any nausea, vomiting, constipation, or diarrhea.  He has no urinary complaints.  Patient feels at his baseline offers no specific complaints today.  REVIEW OF SYSTEMS:   Review of Systems  Constitutional: Negative.  Negative for fever, malaise/fatigue and weight loss.  Respiratory: Negative.  Negative for cough, hemoptysis and shortness of breath.   Cardiovascular: Negative.  Negative for chest pain and leg swelling.  Gastrointestinal: Negative.  Negative for abdominal pain.  Genitourinary: Negative.  Negative for flank pain.  Musculoskeletal: Negative.  Negative for back pain and neck pain.  Skin: Negative.  Negative for rash.  Neurological: Negative.  Negative for focal weakness, weakness and headaches.  Psychiatric/Behavioral: Negative.  The patient is not nervous/anxious.     As per HPI. Otherwise, a complete review of systems is negative.  PAST MEDICAL HISTORY: Past Medical History:  Diagnosis Date  . Acid reflux   . Arthritis   . Atrial fibrillation ([place])   . BPH (benign prostatic hyperplasia)   . CAD  (coronary artery disease)   . Colon adenomas   . Disc displacement, lumbar   . Diverticulosis   . DM (diabetes mellitus) ([place])   . Dysrhythmia   . ED (erectile dysfunction)   . Gross hematuria   . History of kidney stones   . HLD (hyperlipidemia)   . HTN (hypertension)   . Hydronephrosis   . Hypothyroidism   . Kidney stones   . OSA (obstructive sleep apnea)   . Over weight   . Paresthesia of foot   . Peripheral neuropathy   . Peripheral neuropathy   . Renal insufficiency   . Skin cancer   . Sleep apnea   . Thyroid disease     PAST SURGICAL HISTORY: Past Surgical History:  Procedure Laterality Date  . BASAL CELL SKIN CANCER RESECTION    . CARDIAC CATHETERIZATION    . CATARACT EXTRACTION     both eyes sperate times  . COLONOSCOPY N/A 03/26/2016   Procedure: COLONOSCOPY;  Surgeon: [name], MD;  Location: [facility] ENDOSCOPY;  Service: Endoscopy;  Laterality: N/A;  . COLONOSCOPY WITH PROPOFOL N/A 07/08/2016   Procedure: COLONOSCOPY WITH PROPOFOL;  Surgeon: [name], MD;  Location[facility] ENDOSCOPY;  Service: Endoscopy;  Laterality: N/A;  . ESOPHAGOGASTRODUODENOSCOPY (EGD) WITH PROPOFOL N/A 03/26/2016   Procedure: ESOPHAGOGASTRODUODENOSCOPY (EGD) WITH PROPOFOL;  Surgeon: [name], MD;  Location: [facility] ENDOSCOPY;  Service: Endoscopy;  Laterality: N/A;  Diabetic  . ESOPHAGOGASTRODUODENOSCOPY (EGD) WITH PROPOFOL N/A 09/06/2017   Procedure: ESOPHAGOGASTRODUODENOSCOPY (EGD) WITH PROPOFOL;  Surgeon: [name], MD;  Location: [facility] ENDOSCOPY;  Service: Endoscopy;  Laterality: N/A;  . HERNIA REPAIR    . NASAL SINUS SURGERY    . TONSILLECTOMY      FAMILY HISTORY: Family History  Problem Relation Age of Onset  . Stroke Mother   . Heart attack Mother   . Heart attack Father   . Lung cancer Father   . Prostate cancer Neg Hx   . Kidney cancer Neg Hx   . Bladder Cancer Neg Hx     ADVANCED DIRECTIVES (Y/N):  N  HEALTH MAINTENANCE: Social History    Tobacco Use  . Smoking status: Former Smoker    Packs/day: 2.00    Types: Cigarettes    Last attempt to quit: 05/31/1986    Years since quitting: 31.7  . Smokeless tobacco: Never Used  Substance Use Topics  . Alcohol use: Yes    Alcohol/week: 0.0 standard drinks    Comment: socially  . Drug use: No     Colonoscopy:  PAP:  Bone density:  Lipid panel:  Allergies  Allergen Reactions  . Etodolac     Other reaction(s): Other (See Comments) GI BLEED  . Penicillins Rash    Current Outpatient Medications  Medication Sig Dispense Refill  . amLODipine (NORVASC) 5 MG tablet TAKE 1 BY MOUTH DAILY    . apixaban (ELIQUIS) 5 MG TABS tablet TAKE 1 TABLET BY MOUTH TWICE A DAY    . atorvastatin (LIPITOR) 40 MG tablet Take 40 mg by mouth daily.    . carvedilol (COREG) 25 MG tablet Take 25 mg by mouth 2 (two) times daily with a meal.    . doxazosin (CARDURA) 4 MG tablet TAKE 1 BY MOUTH AT BEDTIME    . empagliflozin (JARDIANCE) 25 MG TABS tablet Take 25 mg by mouth daily.    . finasteride (PROSCAR) 5 MG tablet Take by mouth. Once daily    . fluticasone (FLONASE) 50 MCG/ACT nasal spray USE 2 SPRAYS IN EACH NOSTRIL ONCE DAILY AT BEDTIME    . glipiZIDE (GLUCOTROL XL) 10 MG 24 hr tablet Take 10 mg by mouth 2 (two) times daily.     Marland Kitchen glucose blood (ONE TOUCH ULTRA TEST) test strip TEST twice a day or as directed    . levothyroxine (SYNTHROID, LEVOTHROID) 100 MCG tablet Take 100 mcg by mouth daily before breakfast. Take on an empty stomach with a glass of water at least 30-60 minutes before breakfast.    . lisinopril (PRINIVIL,ZESTRIL) 20 MG tablet TAKE 1 BY MOUTH TWICE DAILY    . pantoprazole (PROTONIX) 40 MG tablet TAKE 1 BY MOUTH DAILY    . pioglitazone (ACTOS) 30 MG tablet Take by mouth.    . tamsulosin (FLOMAX) 0.4 MG CAPS capsule TAKE 1 BY MOUTH DAILY    . flecainide (TAMBOCOR) 50 MG tablet Take 50 mg by mouth 2 (two) times daily.     . metoprolol succinate (TOPROL-XL) 25 MG 24 hr tablet  Take by mouth.     No current facility-administered medications for this visit.     OBJECTIVE: Vitals:   02/17/18 1519  BP: (!) 149/75  Pulse: (!) 57  Temp: 97.8 F (36.6 C)     Body mass index is 32.49 kg/m.    ECOG FS:0 - Asymptomatic  General: Well-developed, well-nourished, no acute distress. Eyes: Pink conjunctiva, anicteric sclera. HEENT: Normocephalic, moist mucous membranes, clear oropharnyx. Lungs: Clear to auscultation bilaterally. Heart: Regular rate and rhythm. No rubs, murmurs, or gallops. Abdomen: Soft, nontender, nondistended. No organomegaly noted, normoactive bowel  sounds. Musculoskeletal: No edema, cyanosis, or clubbing. Neuro: Alert, answering all questions appropriately. Cranial nerves grossly intact. Skin: No rashes or petechiae noted. Psych: Normal affect. Lymphatics: No cervical, calvicular, axillary or inguinal LAD.   LAB RESULTS:  Lab Results  Component Value Date   NA 141 07/01/2014   K 3.6 07/01/2014   CL 107 07/01/2014   CO2 28 07/01/2014   GLUCOSE 169 (H) 07/01/2014   BUN 13 07/01/2014   CREATININE 1.04 07/01/2014   CALCIUM 8.1 (L) 07/01/2014   PROT 7.9 06/30/2014   ALBUMIN 3.9 06/30/2014   AST 21 06/30/2014   ALT 19 06/30/2014   ALKPHOS 71 06/30/2014   BILITOT 1.5 (H) 06/30/2014   GFRNONAA >60 07/01/2014   GFRAA >60 07/01/2014    Lab Results  Component Value Date   WBC 10.1 07/01/2014   NEUTROABS 7.6 (H) 07/01/2014   HGB 12.3 (L) 07/01/2014   HCT 37.2 (L) 07/01/2014   MCV 90 07/01/2014   PLT 196 07/01/2014     STUDIES: Ct Chest W Contrast  Result Date: 02/06/2018 CLINICAL DATA:  Pulmonary nodule seen on chest x-ray. EXAM: CT CHEST WITH CONTRAST TECHNIQUE: Multidetector CT imaging of the chest was performed during intravenous contrast administration. CONTRAST:  67mL ISOVUE-300 IOPAMIDOL (ISOVUE-300) INJECTION 61% COMPARISON:  Chest x-ray report from 01/31/2018. Chest CT from 02/09/2012. FINDINGS: Cardiovascular: The heart  size is normal. No substantial pericardial effusion. Coronary artery calcification is evident. Atherosclerotic calcification is noted in the wall of the thoracic aorta. Mediastinum/Nodes: No mediastinal lymphadenopathy. There is no hilar lymphadenopathy. The esophagus has normal imaging features. There is no axillary lymphadenopathy. Lungs/Pleura: The central tracheobronchial airways are patent. Adjacent nodules in the posterior left upper lobe have intervening tiny calcifications. The more medial and cranial nodule measures 1.3 x 1.3 cm (series 3: Image 50). The more inferior and lateral nodule measures 1.6 x 1.1 cm. Inferior to these nodules is an area of architectural distortion/scarring and tiny calcified granulomata. In the left lower lobe, just posterior to these nodules and on the other side of the major fissure is a cluster of tiny calcified granulomata. Upper Abdomen: Unremarkable. Musculoskeletal: No worrisome lytic or sclerotic osseous abnormality. IMPRESSION: 1. Adjacent left upper lobe pulmonary nodules measuring 1.3 and 1.6 cm. These are in the vicinity of calcified granulomata and architectural distortion in the posterior left upper lobe and adjacent left lower lobe. The granulomata and architectural distortion are stable since 02/09/2012, but the nodules are new. PET-CT recommended to further evaluate. 2.  Aortic Atherosclerois (ICD10-170.0) Electronically Signed   By: Misty Stanley M.D.   On: 02/06/2018 15:02   Nm Pet Image Initial (pi) Skull Base To Thigh  Result Date: 02/15/2018 CLINICAL DATA:  Initial treatment strategy for left upper lobe pulmonary nodules. EXAM: NUCLEAR MEDICINE PET SKULL BASE TO THIGH TECHNIQUE: 11.1 mCi F-18 FDG was injected intravenously. Full-ring PET imaging was performed from the skull base to thigh after the radiotracer. CT data was obtained and used for attenuation correction and anatomic localization. Fasting blood glucose: 127 mg/dl COMPARISON:  Chest CT  02/06/2018.  Abdominopelvic CT 06/30/2014. FINDINGS: Mediastinal blood pool activity: SUV max 3.1 NECK: No areas of abnormal hypermetabolism. Incidental CT findings: No cervical adenopathy. CHEST: 2 left upper lobe pulmonary nodules are again identified. The more cephalad and medial measures 1.3 cm and a S.U.V. max of 2.6 on image 90/3. Immediately inferior and lateral to this, a 1.3 cm nodule measures a S.U.V. max of 3.0 on image 93/3. No thoracic nodal hypermetabolism.  Incidental CT findings: Deferred to recent diagnostic CT. Multivessel coronary artery atherosclerosis. Bibasilar scarring. ABDOMEN/PELVIS: Mild hypermetabolism corresponding to minimal right adrenal nodularity. Example at 10 mm and a S.U.V. max of 2.9 on image 154/3. This nodularity is low-density and similar on 06/30/2014, favoring an adenoma. No other abdominopelvic hypermetabolism identified. Incidental CT findings: Minimal left adrenal nodularity is also similar to 2016. 4 mm interpolar left renal collecting system calculus. Abdominal aortic atherosclerosis. Extensive colonic diverticulosis. Fat containing right inguinal hernia. SKELETON: No abnormal marrow activity. Incidental CT findings: none IMPRESSION: 1. Low-level hypermetabolism corresponding to 2 adjacent left upper lobe pulmonary nodules. Although other findings in this area (clustered calcifications and architectural distortion) suggest an underlying infectious etiology, primary bronchogenic carcinoma or carcinomas cannot be excluded. 2. No hypermetabolic thoracic lymph nodes. 3. Right adrenal hypermetabolism, corresponding to stable nodularity relative to 2016. Favor an adenoma. Recommend attention on follow-up. 4. Coronary artery atherosclerosis. Aortic Atherosclerosis (ICD10-I70.0). 5. Left nephrolithiasis. Electronically Signed   By: Abigail Miyamoto M.D.   On: 02/15/2018 13:13    ASSESSMENT: PET positive pulmonary nodule.  PLAN:    1. PET positive pulmonary nodule: CT and PET  scan results reviewed independently and reported as above.  Patient noted to have 2 adjacent left upper lobe pulmonary nodules measuring 1.3 and 1.6 cm.  Both are mildly hypermetabolic.  There are no hypermetabolic thoracic lymph nodes.  Patient had a CT lung screening exam in September 2013 that revealed coarse calcifications in the left upper lobe thought to be related to prior infection or trauma, but no mention of pulmonary nodule.  These calcifications were unchanged since January 2007.  Currently, plan is to repeat CT scan in 3 months to assess for interval change.  Will discuss patient had cancer conference next week to get further input from pulmonology and thoracic surgery.  Will call patient after conference if there is any change to the plan.  I spent a total of 60 minutes face-to-face with the patient of which greater than 50% of the visit was spent in counseling and coordination of care as detailed above.  Patient expressed understanding and was in agreement with this plan. He also understands that He can call clinic at any time with any questions, concerns, or complaints.   Cancer Staging No matching staging information was found for the patient.  Lloyd Huger, MD   02/17/2018 4:58 PM

## 2018-02-20 NOTE — Progress Notes (Signed)
  Oncology Nurse Navigator Documentation  Navigator Location: [facility] (02/17/18 1600) Referral date to RadOnc/MedOnc: 02/16/18 (02/17/18 1600) )Navigator Encounter Type: Initial MedOnc (02/17/18 1600)   Abnormal Finding Date: 02/06/18 (02/17/18 1600)                   Treatment Phase: Abnormal Scans (02/17/18 1600) Barriers/Navigation Needs: Coordination of Care (02/17/18 1600)   Interventions: Coordination of Care (02/17/18 1600)   Coordination of Care: Appts;Radiology (02/17/18 1600)        Acuity: Level 1 (02/17/18 1600) Acuity Level 1: Initial guidance, education and coordination as needed;Minimal follow up required (02/17/18 1600)    met with patient and wife during initial med-onc consultation with Dr. Grayland Ormond. All imaging reviewed by Dr. Grayland Ormond with patient. All questions answered at the time of visit. Reviewed upcoming appts with patient. Informed that his case will be discussed at tumor board this coming Thursday to get further recommendations. Pt informed that will be notified with recommendations Thursday afternoon or Friday. Contact info given and instructed to call with any further questions or needs. Pt and wife verbalized understanding.   Time Spent with Patient: 45 (02/17/18 1600)

## 2018-02-24 ENCOUNTER — Telehealth: Payer: Self-pay | Admitting: *Deleted

## 2018-02-24 DIAGNOSIS — R911 Solitary pulmonary nodule: Secondary | ICD-10-CM

## 2018-02-24 NOTE — Telephone Encounter (Signed)
Case presented at case conference on 02/23/18 with the following recommendations: order PFT's and refer to thoracic surgery, if pt not surgical candidate based on PFT's then will need referral to Dr. Patsey Berthold at [facility] to perform ENB and refer to rad-onc for possible SBRT. Spoke to patient and reviewed recommendations from conference. Pt informed that will start with PFT's on 10/3 at 9:30am with 9:15am arrival and next appt will be based on PFT results. Informed pt that I will be in touch with him next week to review his PFT results and discuss next steps. Instructed pt to call with any further questions or needs. Pt verbalized understanding.

## 2018-03-02 ENCOUNTER — Telehealth: Payer: Self-pay | Admitting: *Deleted

## 2018-03-02 ENCOUNTER — Ambulatory Visit: Payer: Medicare Other | Attending: Oncology

## 2018-03-02 DIAGNOSIS — R911 Solitary pulmonary nodule: Secondary | ICD-10-CM

## 2018-03-02 DIAGNOSIS — R918 Other nonspecific abnormal finding of lung field: Secondary | ICD-10-CM

## 2018-03-02 LAB — BLOOD GAS, ARTERIAL
ACID-BASE EXCESS: 0.5 mmol/L (ref 0.0–2.0)
BICARBONATE: 24.6 mmol/L (ref 20.0–28.0)
FIO2: 0.21
O2 Saturation: 93 %
PCO2 ART: 37 mmHg (ref 32.0–48.0)
PH ART: 7.43 (ref 7.350–7.450)
Patient temperature: 37
pO2, Arterial: 65 mmHg — ABNORMAL LOW (ref 83.0–108.0)

## 2018-03-02 MED ORDER — ALBUTEROL SULFATE (2.5 MG/3ML) 0.083% IN NEBU
2.5000 mg | INHALATION_SOLUTION | Freq: Once | RESPIRATORY_TRACT | Status: AC
  Administered 2018-03-02: 2.5 mg via RESPIRATORY_TRACT
  Filled 2018-03-02: qty 3

## 2018-03-02 NOTE — Telephone Encounter (Signed)
Reviewed results of recent PFT's with patient. Per Dr. Grayland Ormond, pt will need referral to thoracic surgery. Referral entered. Pt will be notified with appt.

## 2018-03-06 ENCOUNTER — Ambulatory Visit (INDEPENDENT_AMBULATORY_CARE_PROVIDER_SITE_OTHER): Payer: Medicare Other | Admitting: Cardiothoracic Surgery

## 2018-03-06 ENCOUNTER — Telehealth: Payer: Self-pay

## 2018-03-06 ENCOUNTER — Encounter: Payer: Self-pay | Admitting: Cardiothoracic Surgery

## 2018-03-06 VITALS — BP 136/76 | HR 76 | Temp 97.7°F | Resp 18 | Ht 69.0 in | Wt 212.2 lb

## 2018-03-06 DIAGNOSIS — R918 Other nonspecific abnormal finding of lung field: Secondary | ICD-10-CM | POA: Diagnosis not present

## 2018-03-06 NOTE — Addendum Note (Signed)
Addended by: Wayna Chalet on: 03/06/2018 11:32 AM   Modules accepted: Orders

## 2018-03-06 NOTE — Progress Notes (Signed)
Patient ID: Christopher [name], male   DOB: 12/16/1945, 72 y.o.   MRN: [id number]  Chief Complaint  Patient presents with  . New Patient (Initial Visit)    Referred By Dr. Doy Hutching Reason for Referral left upper lobe lesion  HPI Location, Quality, Duration, Severity, Timing, Context, Modifying Factors, Associated Signs and Symptoms.  Christopher [name] is a 72 y.o. male.  Several weeks ago he had an episode of left sided chest pain.  He states that he thought that this was a muscle pull because it occurred after he was golfing.  Over the next several days the pain came and went made worse with activities and relieved with rest.  He ultimately sought attention with his primary care physician and a chest x-ray was made.  This revealed a possible left lung mass and a subsequent CT scan and PET scan were performed.  He also had a set of pulmonary function studies made as well.  His pulmonary function studies were normal.  His CT scan and PET scan revealed architectural distortion in both lobes secondary to old infection.  There was some calcifications present in the left upper lobe which were present years ago and now have 2 nodular densities associated with them each measuring about a centimeter or so in size just above the left upper lobe fissure.  The patient has not had any weight loss, fever or chills, hemoptysis or sputum production.  He quit smoking 30 years ago and smoked about 1-1/2 pack cigarettes a day for about 10 to 15 years.  He is able to walk up and down steps but he stops for rest.  He attributes this to being out of shape.  He has undergone 2 previous cardiac catheterizations many years ago.  His last cath he thinks was 3 or 4 years ago.  He sees Dr. Nehemiah Massed for management of his chronic atrial fibrillation.  He currently is in sinus rhythm.  He does take Xarelto for his intermittent atrial fibrillation.  He has extensive coronary artery disease by CT scanning but had a normal stress study about  a year ago.   Past Medical History:  Diagnosis Date  . Acid reflux   . Arthritis   . Atrial fibrillation ([place])   . BPH (benign prostatic hyperplasia)   . CAD (coronary artery disease)   . Colon adenomas   . Disc displacement, lumbar   . Diverticulosis   . DM (diabetes mellitus) ([place])   . Dysrhythmia   . ED (erectile dysfunction)   . Gross hematuria   . History of kidney stones   . HLD (hyperlipidemia)   . HTN (hypertension)   . Hydronephrosis   . Hypothyroidism   . Kidney stones   . OSA (obstructive sleep apnea)   . Over weight   . Paresthesia of foot   . Peripheral neuropathy   . Peripheral neuropathy   . Renal insufficiency   . Skin cancer   . Sleep apnea   . Thyroid disease     Past Surgical History:  Procedure Laterality Date  . BASAL CELL SKIN CANCER RESECTION    . CARDIAC CATHETERIZATION    . CATARACT EXTRACTION     both eyes sperate times  . COLONOSCOPY N/A 03/26/2016   Procedure: COLONOSCOPY;  Surgeon: [name], MD;  Location: [facility] ENDOSCOPY;  Service: Endoscopy;  Laterality: N/A;  . COLONOSCOPY WITH PROPOFOL N/A 07/08/2016   Procedure: COLONOSCOPY WITH PROPOFOL;  Surgeon: [name], MD;  Location: [facility] ENDOSCOPY;  Service: Endoscopy;  Laterality: N/A;  . ESOPHAGOGASTRODUODENOSCOPY (EGD) WITH PROPOFOL N/A 03/26/2016   Procedure: ESOPHAGOGASTRODUODENOSCOPY (EGD) WITH PROPOFOL;  Surgeon: [name], MD;  Location: [facility] ENDOSCOPY;  Service: Endoscopy;  Laterality: N/A;  Diabetic  . ESOPHAGOGASTRODUODENOSCOPY (EGD) WITH PROPOFOL N/A 09/06/2017   Procedure: ESOPHAGOGASTRODUODENOSCOPY (EGD) WITH PROPOFOL;  Surgeon: [name], MD;  Location: [facility] ENDOSCOPY;  Service: Endoscopy;  Laterality: N/A;  . HERNIA REPAIR    . NASAL SINUS SURGERY    . TONSILLECTOMY      Family History  Problem Relation Age of Onset  . Stroke Mother   . Heart attack Mother   . Heart attack Father   . Lung cancer Father   . Cancer Brother   . Lung cancer  Cousin   . Prostate cancer Neg Hx   . Kidney cancer Neg Hx   . Bladder Cancer Neg Hx     Social History Social History   Tobacco Use  . Smoking status: Former Smoker    Packs/day: 2.00    Types: Cigarettes    Last attempt to quit: 05/31/1986    Years since quitting: 31.7  . Smokeless tobacco: Never Used  Substance Use Topics  . Alcohol use: Yes    Alcohol/week: 0.0 standard drinks    Comment: socially  . Drug use: No    Allergies  Allergen Reactions  . Etodolac     Other reaction(s): Other (See Comments) GI BLEED  . Penicillins Rash    Current Outpatient Medications  Medication Sig Dispense Refill  . amLODipine (NORVASC) 5 MG tablet TAKE 1 BY MOUTH DAILY    . apixaban (ELIQUIS) 5 MG TABS tablet TAKE 1 TABLET BY MOUTH TWICE A DAY    . atorvastatin (LIPITOR) 40 MG tablet Take 40 mg by mouth daily.    . carvedilol (COREG) 25 MG tablet Take 25 mg by mouth 2 (two) times daily with a meal.    . doxazosin (CARDURA) 4 MG tablet TAKE 1 BY MOUTH AT BEDTIME    . empagliflozin (JARDIANCE) 25 MG TABS tablet Take 25 mg by mouth daily.    . finasteride (PROSCAR) 5 MG tablet Take by mouth. Once daily    . fluticasone (FLONASE) 50 MCG/ACT nasal spray USE 2 SPRAYS IN EACH NOSTRIL ONCE DAILY AT BEDTIME    . glipiZIDE (GLUCOTROL XL) 10 MG 24 hr tablet Take 10 mg by mouth 2 (two) times daily.     Marland Kitchen glucose blood (ONE TOUCH ULTRA TEST) test strip TEST twice a day or as directed    . levothyroxine (SYNTHROID, LEVOTHROID) 100 MCG tablet Take 100 mcg by mouth daily before breakfast. Take on an empty stomach with a glass of water at least 30-60 minutes before breakfast.    . lisinopril (PRINIVIL,ZESTRIL) 20 MG tablet TAKE 1 BY MOUTH TWICE DAILY    . pantoprazole (PROTONIX) 40 MG tablet TAKE 1 BY MOUTH DAILY    . pioglitazone (ACTOS) 30 MG tablet Take by mouth.    . tamsulosin (FLOMAX) 0.4 MG CAPS capsule TAKE 1 BY MOUTH DAILY    . flecainide (TAMBOCOR) 50 MG tablet Take 50 mg by mouth 2 (two)  times daily.      No current facility-administered medications for this visit.       Review of Systems A complete review of systems was asked and was negative except for the following positive findings reflux, kidney stones, prostate enlargement, glasses, sinus problems, heart disease.  Blood pressure 136/76, pulse 76, temperature 97.7  F (36.5 C), temperature source Temporal, resp. rate 18, height 5\' 9"  (1.753 m), weight 212 lb 3.2 oz (96.3 kg), SpO2 93 %.  Physical Exam CONSTITUTIONAL:  Pleasant, well-developed, well-nourished, and in no acute distress. EYES: Pupils equal and reactive to light, Sclera non-icteric EARS, NOSE, MOUTH AND THROAT:  The oropharynx was clear.  Dentition is good repair.  Oral mucosa pink and moist. LYMPH NODES:  Lymph nodes in the neck and axillae were normal RESPIRATORY:  Lungs were clear.  Normal respiratory effort without pathologic use of accessory muscles of respiration CARDIOVASCULAR: Heart was regular without murmurs.  There were no carotid bruits. GI: The abdomen was soft, nontender, and nondistended. There were no palpable masses. There was no hepatosplenomegaly. There were normal bowel sounds in all quadrants. GU:  Rectal deferred.   MUSCULOSKELETAL:  Normal muscle strength and tone.  No clubbing or cyanosis.   SKIN:  There were no pathologic skin lesions.  There were no nodules on palpation. NEUROLOGIC:  Sensation is normal.  Cranial nerves are grossly intact. PSYCH:  Oriented to person, place and time.  Mood and affect are normal.  Data Reviewed CT scan and PET scan  I have personally reviewed the patient's imaging, laboratory findings and medical records.    Assessment    I have independently reviewed the patient's CT scan and PET scans.  The CT scan is suggestive but certainly not classic for lung cancer.  The PET scan also shows some low-level uptake associated with these nodules raising the possibility that this could be an infectious or  inflammatory problem.    Plan    At the present time I believe that the lesions would warrant biopsy.  I think that a percutaneous approach would be best.  I explained this to him and his daughter-in-law who is a PA in the cancer center.  I think that we should get him to see Dr. Gwynn Burly to stop his Xarelto perform the biopsy and then I will see him back in the office.  All of his questions were answered.       Nestor Lewandowsky, MD 03/06/2018, 10:03 AM

## 2018-03-06 NOTE — Telephone Encounter (Signed)
Christopher [name] called back and she stated that she is now waiting for the IR to let her know when it could be done.  I have faxed cardiac and anti-coagulant clearances were faxed to Dr. Alveria Apley office. Awaiting on response so I could then fax it to [place].

## 2018-03-06 NOTE — Telephone Encounter (Signed)
Christopher [name] at [facility] to schedule a CT Guided Lung Biopsy. Awaiting on her response.

## 2018-03-07 ENCOUNTER — Other Ambulatory Visit: Payer: Self-pay | Admitting: *Deleted

## 2018-03-07 DIAGNOSIS — I2510 Atherosclerotic heart disease of native coronary artery without angina pectoris: Secondary | ICD-10-CM | POA: Insufficient documentation

## 2018-03-07 DIAGNOSIS — R918 Other nonspecific abnormal finding of lung field: Secondary | ICD-10-CM

## 2018-03-08 ENCOUNTER — Other Ambulatory Visit: Payer: Self-pay | Admitting: Pulmonary Disease

## 2018-03-08 ENCOUNTER — Ambulatory Visit: Payer: Medicare Other | Admitting: Pulmonary Disease

## 2018-03-08 ENCOUNTER — Encounter: Payer: Self-pay | Admitting: Pulmonary Disease

## 2018-03-08 VITALS — BP 130/80 | HR 63 | Resp 16 | Ht 69.0 in | Wt 222.0 lb

## 2018-03-08 DIAGNOSIS — Z9989 Dependence on other enabling machines and devices: Secondary | ICD-10-CM

## 2018-03-08 DIAGNOSIS — R911 Solitary pulmonary nodule: Secondary | ICD-10-CM | POA: Diagnosis not present

## 2018-03-08 DIAGNOSIS — G4733 Obstructive sleep apnea (adult) (pediatric): Secondary | ICD-10-CM | POA: Diagnosis not present

## 2018-03-08 DIAGNOSIS — Z6832 Body mass index (BMI) 32.0-32.9, adult: Secondary | ICD-10-CM

## 2018-03-08 DIAGNOSIS — R0789 Other chest pain: Secondary | ICD-10-CM | POA: Diagnosis not present

## 2018-03-08 DIAGNOSIS — I480 Paroxysmal atrial fibrillation: Secondary | ICD-10-CM | POA: Diagnosis not present

## 2018-03-08 NOTE — Patient Instructions (Addendum)
1) Proceed with your stress test. I will review those results before the procedure.  2) We will repeat a CT scan before the procedure to do the procedure planning/mapping.  3) The procedure will be scheduled for 29 October and we will call you with the details of the time.  4) We discussed the details of the procedure today to include potential issues that could arise to include collapse of the lung. Your procedure will be done under general anesthesia.

## 2018-03-08 NOTE — Progress Notes (Signed)
Subjective:    Patient ID: Christopher [name], male    DOB: July 04, 1945, 72 y.o.   MRN: [id number]  HPI The patient is a 72 year old former smoker, who presents for evaluation of the left upper lobe complex nodule and is kindly referred by Dr. Nestor Lewandowsky. I am familiar with the patient's circumstances as his case was discussed at [facility] on 26 September. The patient's issues started somewhere around the first part of September when he developed some issues with left-sided chest pain. The pain first occurred after he had been golfing in the patient thought it was a muscle pull. The pain would come and go depending on his activities and would be relieved by rest. He did seek the attention of his primary care physician after he aggravated the pain when he sneezed. A chest x-ray was performed and a left upper lobe lung nodules/mass was noted. Because of this a CT scan of the chest was performed that showed a complex nodule cluster shaped like a dumbbell. The more medial portion measures 1.3 x 1.3 cm and the mower lateral portion measures 1.6 x 1.1 cm. There are some calcifications within the lesion. I independently reviewed the films and discussed them with the patient. The patient also underwent PET/CT which showed low level FDG activity however there is activity nonetheless. This could represent granuloma formation or low level metabolic lung cancer. The patient was referred for percutaneous needle biopsy however because of the location of the lesions this would be difficult to achieve as it would require a trajectory through the axilla. The patient is then referred to [place] for potential navigational bronchoscopy and biopsy.  The patient has not had any associated symptoms with these nodules. He has had no fevers, chills or sweats. No cough or sputum production, no hemoptysis. His left-sided chest pain is improving. Has had some issues with dyspnea of long-standing but only during heavy exertion and he  attributes this to being "out of shape".  Of note, the patient had had cluster calcifications and architectural distortion did in this area since 2011 however, the two nodules are definitely new.   The patient had pulmonary function testing on 02 March 2018, FEV1 was 3.02 L or 97% predicted, SVC was 3.74 L or 91% predicted. FEV1 percent was 81%. There was no bronchodilator response. Lung volumes were normal with the exception of ERV which was 14% this is consistent with obesity. Diffusion capacity was normal. Flow volume loop is normal. Overall this was a normal study.  I have reviewed the patient's past medical history, surgical history, family history and social history. They are as noted of note the patient smoked 1 1/2 per day for exactly 20 years between 1968 to 1988. 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 The patient served in [employer] for a total three and half years. 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 He has since retired. He has no unusual hobbies. He owns one dog. No hot tubs in the home. He has had no recent exotic travel. No travel out [place]. No known exposure to TB.  The patient notes allergies to Etodolac caused him G.I. bleeding and penicillin which causes a rash. His medications have been reviewed and it's notable for the fact that he is on eloquence for paroxysmal atrial  fibrillation. The patient is also on medications for diabetes.  Patient has a history of obstructive sleep apnea, he has been on CPAP at 9 cm H2O for four years. He is compliant with its use.  Review of Systems  Constitutional: Negative.   HENT: Positive for sneezing.   Eyes: Negative.   Respiratory: Positive for shortness of breath (He attributes to deconditioning).   Cardiovascular: Positive for chest pain  (Appears to be musculoskeletal) and palpitations.  Gastrointestinal:       He does have issues with gastroesophageal reflux symptoms on occasion.  Endocrine: Negative.   Genitourinary: Negative.   Musculoskeletal: Negative.   Skin: Negative.   Allergic/Immunologic: Negative.   Neurological: Negative.   Hematological: Bruises/bleeds easily (On Eliquis).  Psychiatric/Behavioral: Negative.   All other systems reviewed and are negative.      Objective:   Physical Exam  Constitutional: He is oriented to person, place, and time. He appears well-developed.  Non-toxic appearance. He does not appear ill. No distress.  Obese gentlemen and no acute distress.  HENT:  Head: Normocephalic and atraumatic.  Mouth/Throat: Oropharynx is clear and moist. No oropharyngeal exudate or posterior oropharyngeal edema.  Airway: Mallampati I  Eyes: Pupils are equal, round, and reactive to light. EOM are normal.  Neck: Neck supple. No JVD present. No tracheal deviation present. No thyromegaly present.  Cardiovascular: Normal rate, regular rhythm, normal heart sounds and intact distal pulses.  No extrasystoles are present. Exam reveals no gallop, no friction rub and no decreased pulses.  No murmur heard. History of paroxysmal atrial fibrillation, appears to be in sinus rhythm today.  Pulmonary/Chest: Effort normal and breath sounds normal. No accessory muscle usage or stridor. No respiratory distress. He has no wheezes. He has no rhonchi. He has no rales. He exhibits no tenderness.  Abdominal: Soft. Bowel sounds are normal. There is no splenomegaly or hepatomegaly.  Obese  Musculoskeletal: Normal range of motion.       Right lower leg: He exhibits no edema.       Left lower leg: He exhibits no edema.  Lymphadenopathy:    He has no cervical adenopathy.  Neurological: He is alert and oriented to person, place, and time. No cranial nerve deficit.  Skin: Skin is warm and dry. No rash noted. No cyanosis or  erythema. Nails show no clubbing.  Psychiatric: He has a normal mood and affect. His behavior is normal.  Nursing note and vitals reviewed.         Assessment & Plan:   1) Complex solid nodule of the left upper lobe: with two components the shape of a "dumbbell". There is low level FDG activity. The lesions will need further characterization to determine further course of action and therapy. We have discussed the methods available for biopsy. Percutaneous needle biopsy is not feasible due to the location of the lesion and need to go through the axilla for sampling. The lesion could be reached via navigational bronchoscopy and therefore recommend this method of biopsy. For the purpose of this we will need to repeat CT scan for planning/mapping purposes. The patient will also need stress test which has been scheduled for next week to complete the evaluation of his chest pain. The patient understands that navigational bronchoscopy entails general anesthesia, the details of the procedure were discussed with the patient today. Visual charts and the patient's CT scan was used to demonstrate how the procedure is done. The patient understands that there is a higher risk for pneumothorax due  to the proximity of the lesions to the pleura and to the fissural plane between the left upper lobe and left lower lobe. The patient understands the potential benefits, limitations and complications of the procedure and agrees to go ahead. The opportunity to ask questions and these were answered to his satisfaction. He understands that if pneumothorax develops he may need chest tube placement. The procedure is being scheduled for 29 October. The patient will receive further instructions after posting.  2) Chest pain: the patient is having his chest pain evaluated. He is to have a stress test performed next week. We will follow up on these results.  3) Paroxysmal atrial fibrillation on anticoagulation: this issue adds  complexity to his management. He will need discontinuation of Eliquis for at least 72 hours prior to the procedure.  4) Obstructive sleep apnea on CPAP: issue adds complexity to his management. This will have to be taken into account particularly since the patient is going under general anesthesia. He appears to be well compensated in this regard.  5) Obesity with BMI of 32.8, the patient was counseled regards to weight loss.  We'll see the patient and follow-up two weeks after his procedure.  Thank you for allowing me to participate in this patient's care.

## 2018-03-09 ENCOUNTER — Other Ambulatory Visit: Payer: Self-pay | Admitting: Pulmonary Disease

## 2018-03-09 ENCOUNTER — Other Ambulatory Visit: Payer: Self-pay

## 2018-03-09 DIAGNOSIS — R911 Solitary pulmonary nodule: Secondary | ICD-10-CM

## 2018-03-10 ENCOUNTER — Ambulatory Visit: Payer: Self-pay | Admitting: Cardiothoracic Surgery

## 2018-03-13 ENCOUNTER — Telehealth: Payer: Self-pay

## 2018-03-13 NOTE — Telephone Encounter (Signed)
Spoke to patient to let him know his SuperD CT is scheduled for 10/23 at 10:30 am. Preadmit appointment is 10/22@ 9:45. Patient also aware to stop his Eliquis on 03/24/18. Patient aware with no questions at this time.

## 2018-03-21 ENCOUNTER — Encounter
Admission: RE | Admit: 2018-03-21 | Discharge: 2018-03-21 | Disposition: A | Discharge status: Home / Self Care | Payer: Medicare Other | Source: Ambulatory Visit | Attending: Pulmonary Disease | Admitting: Pulmonary Disease

## 2018-03-21 ENCOUNTER — Other Ambulatory Visit: Payer: Self-pay

## 2018-03-21 DIAGNOSIS — R918 Other nonspecific abnormal finding of lung field: Secondary | ICD-10-CM | POA: Diagnosis not present

## 2018-03-21 DIAGNOSIS — I700 Atherosclerosis of aorta: Secondary | ICD-10-CM | POA: Diagnosis not present

## 2018-03-21 DIAGNOSIS — R911 Solitary pulmonary nodule: Secondary | ICD-10-CM | POA: Diagnosis present

## 2018-03-21 LAB — BASIC METABOLIC PANEL
Anion gap: 7 (ref 5–15)
BUN: 15 mg/dL (ref 8–23)
CALCIUM: 9.2 mg/dL (ref 8.9–10.3)
CO2: 27 mmol/L (ref 22–32)
CREATININE: 1.03 mg/dL (ref 0.61–1.24)
Chloride: 108 mmol/L (ref 98–111)
GFR calc Af Amer: >60 mL/min (ref >60)
GFR calc non Af Amer: >60 mL/min (ref >60)
GLUCOSE: 204 mg/dL — AB (ref 70–99)
Potassium: 3.9 mmol/L (ref 3.5–5.1)
Sodium: 142 mmol/L (ref 135–145)

## 2018-03-21 LAB — CBC
HCT: 44.5 % (ref 39.0–52.0)
Hemoglobin: 14.8 g/dL (ref 13.0–17.0)
MCH: 31.8 pg (ref 26.0–34.0)
MCHC: 33.3 g/dL (ref 30.0–36.0)
MCV: 95.5 fL (ref 80.0–100.0)
PLATELETS: 182 10*3/uL (ref 150–400)
RBC: 4.66 MIL/uL (ref 4.22–5.81)
RDW: 13.2 % (ref 11.5–15.5)
WBC: 6.3 10*3/uL (ref 4.0–10.5)
nRBC: 0 % (ref 0.0–0.2)

## 2018-03-21 NOTE — Patient Instructions (Signed)
Your procedure is scheduled on: Tuesday, March 28, 2018 Report to Day Surgery on the 2nd floor of the [facility]. To find out your arrival time, please call [phone] between 1PM - 3PM on: Monday, October 28 ,2019  REMEMBER: Instructions that are not followed completely may result in serious medical risk, up to and including death; or upon the discretion of your surgeon and anesthesiologist your surgery may need to be rescheduled.  Do not eat food after midnight the night before surgery.  No gum chewing, lozengers or hard candies.  You may however, drink water up to 2 hours before you are scheduled to arrive for your surgery. Do not drink anything within 2 hours of the start of your surgery.  No Alcohol for 24 hours before or after surgery.  No Smoking including e-cigarettes for 24 hours prior to surgery.  No chewable tobacco products for at least 6 hours prior to surgery.  No nicotine patches on the day of surgery.  On the morning of surgery brush your teeth with toothpaste and water, you may rinse your mouth with mouthwash if you wish. Do not swallow any toothpaste or mouthwash.  Notify your doctor if there is any change in your medical condition (cold, fever, infection).  Do not wear jewelry, make-up, hairpins, clips or nail polish.  Do not wear lotions, powders, or perfumes. You may wear deodorant.  Do not shave 48 hours prior to surgery. Men may shave face and neck.  Contacts and dentures may not be worn into surgery.  Do not bring valuables to the hospital, including drivers license, insurance or credit cards.  [facility] is not responsible for any belongings or valuables.   TAKE THESE MEDICATIONS THE MORNING OF SURGERY:  1.  Amlodipine 2.  Carvedilol 3.  Doxazosin 4.  Finasteride 5.  Flecainide 6.  Levothyroxine 7.  Pantoprazole (take one the night before surgery and one the morning of surgery - helps to prevent nausea after surgery)  Bring your C-PAP to  the hospital with you in case you may have to spend the night.   Follow recommendations from Cardiologist, Pulmonologist or PCP regarding stopping Eliquis. Stop 3 days before surgery according to Dr. Alveria Apley note.  NOW!  Stop Anti-inflammatories (NSAIDS) such as Advil, Aleve, Ibuprofen, Motrin, Naproxen, Naprosyn and Aspirin based products such as Excedrin, Goodys Powder, BC Powder. (May take Tylenol or Acetaminophen if needed.)  NOW!  Stop ANY OVER THE COUNTER supplements until after surgery.  If you are being discharged the day of surgery, you will not be allowed to drive home. You will need a responsible adult to drive you home and stay with you that night.   If you are taking public transportation, you will need to have a responsible adult with you. Please confirm with your physician that it is acceptable to use public transportation.   Please call [phone] if you have any questions about these instructions.

## 2018-03-22 ENCOUNTER — Ambulatory Visit
Admission: RE | Admit: 2018-03-22 | Discharge: 2018-03-22 | Disposition: A | Discharge status: Home / Self Care | Payer: Medicare Other | Source: Ambulatory Visit | Attending: Pulmonary Disease | Admitting: Pulmonary Disease

## 2018-03-22 DIAGNOSIS — R918 Other nonspecific abnormal finding of lung field: Secondary | ICD-10-CM | POA: Diagnosis not present

## 2018-03-22 DIAGNOSIS — I700 Atherosclerosis of aorta: Secondary | ICD-10-CM | POA: Insufficient documentation

## 2018-03-22 DIAGNOSIS — R911 Solitary pulmonary nodule: Secondary | ICD-10-CM

## 2018-03-28 ENCOUNTER — Encounter
Admission: RE | Disposition: A | Discharge status: Home / Self Care | Payer: Self-pay | Source: Ambulatory Visit | Attending: Pulmonary Disease

## 2018-03-28 ENCOUNTER — Ambulatory Visit: Payer: Medicare Other | Admitting: Certified Registered"

## 2018-03-28 ENCOUNTER — Ambulatory Visit: Payer: Medicare Other

## 2018-03-28 ENCOUNTER — Ambulatory Visit
Admission: RE | Admit: 2018-03-28 | Discharge: 2018-03-28 | Disposition: A | Discharge status: Home / Self Care | Payer: Medicare Other | Source: Ambulatory Visit | Attending: Pulmonary Disease | Admitting: Pulmonary Disease

## 2018-03-28 ENCOUNTER — Encounter: Payer: Self-pay | Admitting: Pulmonary Disease

## 2018-03-28 ENCOUNTER — Other Ambulatory Visit: Payer: Self-pay

## 2018-03-28 DIAGNOSIS — I10 Essential (primary) hypertension: Secondary | ICD-10-CM | POA: Insufficient documentation

## 2018-03-28 DIAGNOSIS — I480 Paroxysmal atrial fibrillation: Secondary | ICD-10-CM | POA: Insufficient documentation

## 2018-03-28 DIAGNOSIS — J8410 Pulmonary fibrosis, unspecified: Secondary | ICD-10-CM | POA: Diagnosis not present

## 2018-03-28 DIAGNOSIS — Z6832 Body mass index (BMI) 32.0-32.9, adult: Secondary | ICD-10-CM | POA: Insufficient documentation

## 2018-03-28 DIAGNOSIS — E669 Obesity, unspecified: Secondary | ICD-10-CM | POA: Insufficient documentation

## 2018-03-28 DIAGNOSIS — Z87891 Personal history of nicotine dependence: Secondary | ICD-10-CM | POA: Insufficient documentation

## 2018-03-28 DIAGNOSIS — R911 Solitary pulmonary nodule: Secondary | ICD-10-CM | POA: Diagnosis not present

## 2018-03-28 DIAGNOSIS — Z79899 Other long term (current) drug therapy: Secondary | ICD-10-CM | POA: Diagnosis not present

## 2018-03-28 DIAGNOSIS — E119 Type 2 diabetes mellitus without complications: Secondary | ICD-10-CM | POA: Insufficient documentation

## 2018-03-28 DIAGNOSIS — Z7984 Long term (current) use of oral hypoglycemic drugs: Secondary | ICD-10-CM | POA: Diagnosis not present

## 2018-03-28 DIAGNOSIS — I2510 Atherosclerotic heart disease of native coronary artery without angina pectoris: Secondary | ICD-10-CM | POA: Diagnosis not present

## 2018-03-28 DIAGNOSIS — R0789 Other chest pain: Secondary | ICD-10-CM | POA: Insufficient documentation

## 2018-03-28 DIAGNOSIS — G4733 Obstructive sleep apnea (adult) (pediatric): Secondary | ICD-10-CM | POA: Diagnosis not present

## 2018-03-28 DIAGNOSIS — Z9889 Other specified postprocedural states: Secondary | ICD-10-CM

## 2018-03-28 HISTORY — PX: FLEXIBLE BRONCHOSCOPY: SHX5094

## 2018-03-28 LAB — GLUCOSE, CAPILLARY: Glucose-Capillary: 109 mg/dL — ABNORMAL HIGH (ref 70–99)

## 2018-03-28 SURGERY — BRONCHOSCOPY, FLEXIBLE
Anesthesia: General | Laterality: Left

## 2018-03-28 MED ORDER — ROCURONIUM BROMIDE 100 MG/10ML IV SOLN
INTRAVENOUS | Status: DC | PRN
  Administered 2018-03-28: 50 mg via INTRAVENOUS

## 2018-03-28 MED ORDER — PROMETHAZINE HCL 25 MG/ML IJ SOLN: 6.2500 mg | INTRAMUSCULAR | Status: DC | PRN

## 2018-03-28 MED ORDER — PHENYLEPHRINE HCL 10 MG/ML IJ SOLN
INTRAMUSCULAR | Status: DC | PRN
  Administered 2018-03-28 (×3): 200 ug via INTRAVENOUS

## 2018-03-28 MED ORDER — FENTANYL CITRATE (PF) 100 MCG/2ML IJ SOLN
INTRAMUSCULAR | Status: DC | PRN
  Administered 2018-03-28: 50 ug via INTRAVENOUS

## 2018-03-28 MED ORDER — FENTANYL CITRATE (PF) 100 MCG/2ML IJ SOLN
INTRAMUSCULAR | Status: AC
  Filled 2018-03-28: qty 2

## 2018-03-28 MED ORDER — DEXAMETHASONE SODIUM PHOSPHATE 10 MG/ML IJ SOLN
INTRAMUSCULAR | Status: DC | PRN
  Administered 2018-03-28: 10 mg via INTRAVENOUS

## 2018-03-28 MED ORDER — ONDANSETRON HCL 4 MG/2ML IJ SOLN
INTRAMUSCULAR | Status: DC | PRN
  Administered 2018-03-28: 4 mg via INTRAVENOUS

## 2018-03-28 MED ORDER — LACTATED RINGERS IV SOLN
INTRAVENOUS | Status: DC | PRN
  Administered 2018-03-28: 13:00:00 via INTRAVENOUS

## 2018-03-28 MED ORDER — SUGAMMADEX SODIUM 200 MG/2ML IV SOLN
INTRAVENOUS | Status: DC | PRN
  Administered 2018-03-28: 200 mg via INTRAVENOUS

## 2018-03-28 MED ORDER — LIDOCAINE HCL (CARDIAC) PF 100 MG/5ML IV SOSY
PREFILLED_SYRINGE | INTRAVENOUS | Status: DC | PRN
  Administered 2018-03-28: 100 mg via INTRAVENOUS

## 2018-03-28 MED ORDER — OXYCODONE HCL 5 MG PO TABS: 5.0000 mg | ORAL_TABLET | Freq: Once | ORAL | Status: DC | PRN

## 2018-03-28 MED ORDER — PROPOFOL 10 MG/ML IV BOLUS
INTRAVENOUS | Status: AC
  Filled 2018-03-28: qty 20

## 2018-03-28 MED ORDER — MIDAZOLAM HCL 2 MG/2ML IJ SOLN
INTRAMUSCULAR | Status: AC
  Filled 2018-03-28: qty 2

## 2018-03-28 MED ORDER — PROPOFOL 10 MG/ML IV BOLUS
INTRAVENOUS | Status: DC | PRN
  Administered 2018-03-28: 150 mg via INTRAVENOUS

## 2018-03-28 MED ORDER — EPHEDRINE SULFATE 50 MG/ML IJ SOLN
INTRAMUSCULAR | Status: DC | PRN
  Administered 2018-03-28 (×2): 10 mg via INTRAVENOUS

## 2018-03-28 MED ORDER — SODIUM CHLORIDE 0.9 % IV SOLN: Freq: Once | INTRAVENOUS | Status: DC

## 2018-03-28 MED ORDER — OXYCODONE HCL 5 MG/5ML PO SOLN: 5.0000 mg | Freq: Once | ORAL | Status: DC | PRN

## 2018-03-28 MED ORDER — MEPERIDINE HCL 50 MG/ML IJ SOLN: 6.2500 mg | INTRAMUSCULAR | Status: DC | PRN

## 2018-03-28 MED ORDER — MIDAZOLAM HCL 2 MG/2ML IJ SOLN
INTRAMUSCULAR | Status: DC | PRN
  Administered 2018-03-28: 2 mg via INTRAVENOUS

## 2018-03-28 MED ORDER — FENTANYL CITRATE (PF) 100 MCG/2ML IJ SOLN: 25.0000 ug | INTRAMUSCULAR | Status: DC | PRN

## 2018-03-28 NOTE — H&P (Signed)
Tyler Pita, MD  Physician  Critical Care  Progress Notes  Signed  Encounter Date:  03/08/2018       Related encounter: Office Visit from 03/08/2018 in [facility] [place]      Signed           Subjective:    Patient ID: Christopher [name], male    DOB: July 30, 1945, 72 y.o.   MRN: [id number]  HPI The patient is a 72 year old former smoker, who presents for evaluation of the left upper lobe complex nodule and is kindly referred by Dr. Nestor Lewandowsky. I am familiar with the patient's circumstances as his case was discussed at [facility] on 26 September. The patient's issues started somewhere around the first part of September when he developed some issues with left-sided chest pain. The pain first occurred after he had been golfing in the patient thought it was a muscle pull. The pain would come and go depending on his activities and would be relieved by rest. He did seek the attention of his primary care physician after he aggravated the pain when he sneezed. A chest x-ray was performed and a left upper lobe lung nodules/mass was noted. Because of this a CT scan of the chest was performed that showed a complex nodule cluster shaped like a dumbbell. The more medial portion measures 1.3 x 1.3 cm and the mower lateral portion measures 1.6 x 1.1 cm. There are some calcifications within the lesion. I independently reviewed the films and discussed them with the patient. The patient also underwent PET/CT which showed low level FDG activity however there is activity nonetheless. This could represent granuloma formation or low level metabolic lung cancer. The patient was referred for percutaneous needle biopsy however because of the location of the lesions this would be difficult to achieve as it would require a trajectory through the axilla. The patient is then referred to [place] for potential navigational bronchoscopy and biopsy.  The patient has not had any associated symptoms with these  nodules. He has had no fevers, chills or sweats. No cough or sputum production, no hemoptysis. His left-sided chest pain is improving. Has had some issues with dyspnea of long-standing but only during heavy exertion and he attributes this to being "out of shape".  Of note, the patient had had cluster calcifications and architectural distortion did in this area since 2011 however, the two nodules are definitely new.   The patient had pulmonary function testing on 02 March 2018, FEV1 was 3.02 L or 97% predicted, SVC was 3.74 L or 91% predicted. FEV1 percent was 81%. There was no bronchodilator response. Lung volumes were normal with the exception of ERV which was 14% this is consistent with obesity. Diffusion capacity was normal. Flow volume loop is normal. Overall this was a normal study.  I have reviewed the patient's past medical history, surgical history, family history and social history. They are as noted of note the patient smoked 1 1/2 per day for exactly 20 years between 1968 to 1988. 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 The patient served in [employer] for a total three and half years. 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 He has since retired. He has no unusual hobbies. He owns one dog. No hot tubs in the home. He has  had no recent exotic travel. No travel out [place]. No known exposure to TB.  The patient notes allergies to Etodolac caused him G.I. bleeding and penicillin which causes a rash. His medications have been reviewed and it's notable for the fact that he is on eloquence for paroxysmal atrial fibrillation. The patient is also on medications for diabetes.  Patient has a history of obstructive sleep apnea, he has been on CPAP at 9 cm H2O for four years. He is compliant with its  use.  Review of Systems  Constitutional: Negative.   HENT: Positive for sneezing.   Eyes: Negative.   Respiratory: Positive for shortness of breath (He attributes to deconditioning).   Cardiovascular: Positive for chest pain (Appears to be musculoskeletal) and palpitations.  Gastrointestinal:       He does have issues with gastroesophageal reflux symptoms on occasion.  Endocrine: Negative.   Genitourinary: Negative.   Musculoskeletal: Negative.   Skin: Negative.   Allergic/Immunologic: Negative.   Neurological: Negative.   Hematological: Bruises/bleeds easily (On Eliquis).  Psychiatric/Behavioral: Negative.   All other systems reviewed and are negative.      Objective:   Physical Exam  Constitutional: He is oriented to person, place, and time. He appears well-developed.  Non-toxic appearance. He does not appear ill. No distress.  Obese gentlemen and no acute distress.  HENT:  Head: Normocephalic and atraumatic.  Mouth/Throat: Oropharynx is clear and moist. No oropharyngeal exudate or posterior oropharyngeal edema.  Airway: Mallampati I  Eyes: Pupils are equal, round, and reactive to light. EOM are normal.  Neck: Neck supple. No JVD present. No tracheal deviation present. No thyromegaly present.  Cardiovascular: Normal rate, regular rhythm, normal heart sounds and intact distal pulses.  No extrasystoles are present. Exam reveals no gallop, no friction rub and no decreased pulses.  No murmur heard. History of paroxysmal atrial fibrillation, appears to be in sinus rhythm today.  Pulmonary/Chest: Effort normal and breath sounds normal. No accessory muscle usage or stridor. No respiratory distress. He has no wheezes. He has no rhonchi. He has no rales. He exhibits no tenderness.  Abdominal: Soft. Bowel sounds are normal. There is no splenomegaly or hepatomegaly.  Obese  Musculoskeletal: Normal range of motion.       Right lower leg: He exhibits no edema.       Left lower leg: He  exhibits no edema.  Lymphadenopathy:    He has no cervical adenopathy.  Neurological: He is alert and oriented to person, place, and time. No cranial nerve deficit.  Skin: Skin is warm and dry. No rash noted. No cyanosis or erythema. Nails show no clubbing.  Psychiatric: He has a normal mood and affect. His behavior is normal.  Nursing note and vitals reviewed.         Assessment & Plan:   1) Complex solid nodule of the left upper lobe: with two components the shape of a "dumbbell". There is low level FDG activity. The lesions will need further characterization to determine further course of action and therapy. We have discussed the methods available for biopsy. Percutaneous needle biopsy is not feasible due to the location of the lesion and need to go through the axilla for sampling. The lesion could be reached via navigational bronchoscopy and therefore recommend this method of biopsy. For the purpose of this we will need to repeat CT scan for planning/mapping purposes. The patient will also need stress test which has been scheduled for next week to complete the evaluation of his  chest pain. The patient understands that navigational bronchoscopy entails general anesthesia, the details of the procedure were discussed with the patient today. Visual charts and the patient's CT scan was used to demonstrate how the procedure is done. The patient understands that there is a higher risk for pneumothorax due to the proximity of the lesions to the pleura and to the fissural plane between the left upper lobe and left lower lobe. The patient understands the potential benefits, limitations and complications of the procedure and agrees to go ahead. The opportunity to ask questions and these were answered to his satisfaction. He understands that if pneumothorax develops he may need chest tube placement. The procedure is being scheduled for 29 October. The patient will receive further instructions after  posting.  2) Chest pain: the patient is having his chest pain evaluated. He is to have a stress test performed next week. We will follow up on these results.  3) Paroxysmal atrial fibrillation on anticoagulation: this issue adds complexity to his management. He will need discontinuation of Eliquis for at least 72 hours prior to the procedure.  4) Obstructive sleep apnea on CPAP: issue adds complexity to his management. This will have to be taken into account particularly since the patient is going under general anesthesia. He appears to be well compensated in this regard.  5) Obesity with BMI of 32.8, the patient was counseled regards to weight loss.  We'll see the patient and follow-up two weeks after his procedure.  Thank you for allowing me to participate in this patient's care.         Patient was seen prior to the procedure and no changes were noted from the above visit of 08 March 2018.

## 2018-03-28 NOTE — Transfer of Care (Signed)
Immediate Anesthesia Transfer of Care Note  Patient: Christopher [name]  Procedure(s) Performed: BRONCHOSCOPY WITH NAVIGATION AND CELLVIZIO (Left )  Patient Location: PACU  Anesthesia Type:General  Level of Consciousness: awake, alert  and oriented  Airway & Oxygen Therapy: Patient Spontanous Breathing and Patient connected to nasal cannula oxygen  [name]-op Assessment: Report given to RN and [name] -op Vital signs reviewed and stable  [name] vital signs: Reviewed and stable  Last Vitals:  Vitals Value Taken Time  BP 119/72 03/28/2018  2:46 PM  Temp    Pulse 64 03/28/2018  2:47 PM  Resp 8 03/28/2018  2:47 PM  SpO2 95 % 03/28/2018  2:47 PM  Vitals shown include unvalidated device data.  Last Pain:  Vitals:   03/28/18 1218  TempSrc: Oral  PainSc: 0-No pain         Complications: No apparent anesthesia complications

## 2018-03-28 NOTE — Anesthesia Procedure Notes (Addendum)
Procedure Name: Intubation Date/Time: 03/28/2018 1:24 PM Performed by: Allean Found, CRNA Pre-anesthesia Checklist: Patient identified, Patient being monitored, Timeout performed, Emergency Drugs available and Suction available Patient Re-evaluated:Patient Re-evaluated prior to induction Oxygen Delivery Method: Circle system utilized Preoxygenation: Pre-oxygenation with 100% oxygen Induction Type: IV induction Ventilation: Mask ventilation without difficulty Laryngoscope Size: McGraph and 4 Grade View: Grade I Tube type: Oral Tube size: 8.0 mm Number of attempts: 1 Airway Equipment and Method: Stylet Placement Confirmation: ETT inserted through vocal cords under direct vision,  positive ETCO2 and breath sounds checked- equal and bilateral Secured at: 22 cm Tube secured with: Tape Dental Injury: Teeth and Oropharynx as per pre-operative assessment

## 2018-03-28 NOTE — Anesthesia Post-op Follow-up Note (Signed)
Anesthesia QCDR form completed.        

## 2018-03-28 NOTE — Op Note (Signed)
Electromagnetic Navigation Bronchoscopy: Indication: Lung mass/nodule left upper lobe  Preoperative Diagnosis:Lung Nodule/Mass Post Procedure Diagnosis:Lung Nodule/Mass, likely inflammatory Consent: Verbal/Written  The Risks and Benefits of the procedure explained to patient/family prior to start of procedure and I have discussed the risk for acute bleeding, increased chance of infection, increased chance of respiratory failure and cardiac arrest and death.  Prior to the procedure also consultation note/ H&P from the visit on 08 March 2018 was reviewed and no new changes were noted.  I have also explained to avoid all types of NSAIDs to decrease chance of bleeding, and to avoid food and drinks the midnight prior to procedure. Patient had avoided anticoagulants for five days.  The procedure consists of a video camera with a light source to be placed and inserted  into the lungs to  look for abnormal tissue and to obtain tissue samples by using needle and biopsy tools.  The patient/family understand the risks and benefits and have agreed to proceed with procedure.   Hand washing performed prior to starting the procedure.   Type of Anesthesia: General Anesthesia, see Anesthesiology records .   Procedure Performed: 1) Virtual Bronchoscopy with Multi-planar Image analysis, 3-D reconstruction of coronal, sagittal and multi-planar images for the purposes of planning real-time bronchoscopy using the iLogic Electromagnetic Navigation Bronchoscopy System (superDimension).  2) Cellvizio Confocal Laser Endomicroscopy  Description of Procedure: After obtaining informed consent from the patient, the above sedative and anesthetic measures were carried out, flexible fiberoptic bronchoscope was inserted via Endotracheal tube using a vortex adapter after patient was inducted under general anesthesia and intubated by CNA/Anesthesiologist.   Bronchoscopy was carried out to inspect the airways. The airways  were free of endobronchial lesions however there were areas where inflammation and probability of the mucosa could be seen particularly on the upper lobes. There were some copious and specific secretions that were lavaged till clear. At this point a super dimension locatable guide and extended working channel or placed into the working channel of the bronchoscope. This was then advanced into the central portion of the trachea. At this point the locatable guide was directed to standard registration points at the following centers: main carina, right upper lobe bronchus, right lower lobe bronchus, right middle lobe bronchus, left upper lobe bronchus, and the left lower lobe bronchus. This data was transferred to the i-Logic ENB system for real-time bronchoscopy.   The scope was then navigated to the LUL for tissue sampling. Target acquisition was confirmed by fluoroscopy once locatable guide had reached it. Once this was done the locatable guide was removed and a Cellvizio confocal laser endomicroscopy probe was introduced through the extended working channel of the scope and the area of target acquisition was examined. The target appear to be somewhat off-center, so the working channel sheath was told back some and target was then acquired with the sole Visio probe. Once this was done the sheath was then advanced via Seldinger technique to the get. At this point cell Visio Endo microscopy confirmed target acquisition. The tissue appeared to have inflammatory abnormalities. This point Cellvizio probe was removed and super dimension forceps were used to biopsy the area. After two biopsies which were fluoroscopically  guided, the super dimension locatable guide was then placed back in the standard working channel and the target was reacquired as it had moved slightly. Additional biopsies were obtained at this point. Once this was completed a single brushing was obtained of this area also  under fluoroscopic guidance.  This was  followed by sampling with an [employer] 21 gauge needle four. Prior to completing the procedure targeted bronchoalveolar lavage performed with 40 ML's of saline with an aliquot of about 8 ML's return. This was done times 21 samples sent for culture and one for cytology.    Specimans Obtained:  Transbronchial Fine Needle Aspirations 21G times: 4 Transbronchial Forceps Biopsy times:8  Transbronchial Triple Needle Brush: N/A  Transbronchial Single Brush:1  BAL: Targeted for cytology and cultures X2, same subsegment total of eight ML's each aliquot.  Fluoroscopy:  Fluoroscopy was utilized during the course of this procedure to assure that biopsies were taken in a safe manner under fluoroscopic guidance with spot films as required.   Complications:None  Estimated Blood Loss: less than 2 ml  Monitoring: the patient was monitored by the CRNA with the parameters required for general anesthesia.  Patient tolerated the procedure well. After the procedure was completed he was allowed to emerge from general anesthesia and was extubated in the procedure room without sequela. He was taken to the PACU in satisfactory condition. Post procedure chest x-ray showed no pneumothorax.  Assessment and Plan/Additional Comments: 1) Left upper lobe nodule/mass, likely inflammatory.  Follow up Pathology/Culture Reports    C. Derrill Kay, M.D.  [facility]  Advanced Bronchoscopy

## 2018-03-28 NOTE — Discharge Instructions (Addendum)
Can start Eliquis tomorrow morning.   Flexible Bronchoscopy, Care After These instructions give you information on caring for yourself after your procedure. Your doctor may also give you more specific instructions. Call your doctor if you have any problems or questions after your procedure. Follow these instructions at home:  Do not eat or drink anything for 2 hours after your procedure. If you try to eat or drink before the medicine wears off, food or drink could go into your lungs. You could also burn yourself.  After 2 hours have passed and when you can cough and gag normally, you may eat soft food and drink liquids slowly.  The day after the test, you may eat your normal diet.  You may do your normal activities.  Keep all doctor visits. Get help right away if:  You get more and more short of breath.  You get light-headed.  You feel like you are going to pass out (faint).  You have chest pain.  You have new problems that worry you.  You cough up more than a little blood.  You cough up more blood than before. This information is not intended to replace advice given to you by your health care provider. Make sure you discuss any questions you have with your health care provider. Document Released: 03/14/2009 Document Revised: 10/23/2015 Document Reviewed: 01/19/2013 Elsevier Interactive Patient Education  2017 [place]   1) The drugs that you were given will stay in your system until tomorrow so for the next 24 hours you should not:  A) Drive an automobile B) Make any legal decisions C) Drink any alcoholic beverage   2) You may resume regular meals tomorrow.  Today it is better to start with liquids and gradually work up to solid foods.  You may eat anything you prefer, but it is better to start with liquids, then soup and crackers, and gradually work up to solid foods.   3) Please notify your doctor immediately if  you have any unusual bleeding, trouble breathing, redness and pain at the surgery site, drainage, fever, or pain not relieved by medication.    4) Additional Instructions:        Please contact your physician with any problems or Same Day Surgery at [phone], Monday through Friday 6 am to 4 pm, or [place] at [facility] number at [phone].

## 2018-03-28 NOTE — Anesthesia Procedure Notes (Signed)
Procedure Name: Intubation Date/Time: 03/28/2018 1:24 PM Performed by: Allean Found, CRNA Pre-anesthesia Checklist: Patient identified, Emergency Drugs available, Suction available, Patient being monitored and Timeout performed Patient Re-evaluated:Patient Re-evaluated prior to induction Oxygen Delivery Method: Circle system utilized and Simple face mask Preoxygenation: Pre-oxygenation with 100% oxygen Induction Type: IV induction Ventilation: Mask ventilation without difficulty Laryngoscope Size: McGraph and 4 Grade View: Grade III Tube type: Oral Tube size: 8.0 mm Number of attempts: 2 Airway Equipment and Method: Stylet Secured at: 20 cm Tube secured with: Tape Dental Injury: Teeth and Oropharynx as per pre-operative assessment  Difficulty Due To: Difficult Airway- due to anterior larynx

## 2018-03-28 NOTE — Anesthesia Preprocedure Evaluation (Addendum)
Anesthesia Evaluation  Patient identified by MRN, date of birth, ID band Patient awake    Reviewed: Allergy & Precautions, NPO status , Patient's Chart, lab work & pertinent test results  History of Anesthesia Complications Negative for: history of anesthetic complications  Airway Mallampati: II  TM Distance: >3 FB Neck ROM: Full    Dental no notable dental hx.    Pulmonary sleep apnea and Continuous Positive Airway Pressure Ventilation , neg COPD, former smoker,    breath sounds clear to auscultation- rhonchi (-) wheezing      Cardiovascular hypertension, Pt. on medications (-) CAD, (-) Past MI, (-) Cardiac Stents and (-) CABG + dysrhythmias (hx of PAF)  Rhythm:Regular Rate:Normal - Systolic murmurs and - Diastolic murmurs NM stress test 03/19/18: Normal treadmill EKG without evidence of ischemia or arrhythmia Normal myocardial perfusion without evidence of myocardial ischemia  Echo 03/15/18: NORMAL LEFT VENTRICULAR SYSTOLIC FUNCTION WITH AN ESTIMATED EF = 50 % NORMAL RIGHT VENTRICULAR SYSTOLIC FUNCTION MODERATE MITRAL VALVE INSUFFICIENCY MILD-TO-MODERATE TRICUSPID VALVE INSUFFICIENCY TRACE AORTIC VALVE INSUFFICIENCY NO VALVULAR STENOSIS MILD RV ENLARGEMENT MILD BIATRIAL ENLARGEMENT   Neuro/Psych negative neurological ROS  negative psych ROS   GI/Hepatic Neg liver ROS, GERD  ,  Endo/Other  diabetes, Oral Hypoglycemic AgentsHypothyroidism   Renal/GU Renal disease: hx of nephrolithiasis.     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) No date: Dysrhythmia No date: ED (erectile dysfunction) No date: Gross hematuria No date: History of kidney stones No date: HLD (hyperlipidemia) No date: HTN (hypertension) No date: Hydronephrosis No date: Hypothyroidism No date: Kidney stones No date: OSA (obstructive sleep apnea) No date: Over weight No date: Paresthesia of foot No date: Peripheral neuropathy No date: Peripheral neuropathy No date: Renal insufficiency No date: Skin cancer No date: Sleep apnea No date: Thyroid disease   Reproductive/Obstetrics                            Anesthesia Physical Anesthesia Plan  ASA: III  Anesthesia Plan: General   Post-op Pain Management:    Induction: Intravenous  PONV Risk Score and Plan: 1 and Ondansetron and Midazolam  Airway Management Planned: Oral ETT  Additional Equipment:   Intra-op Plan:   Post-operative Plan: Extubation in OR  Informed Consent: I have reviewed the patients History and Physical, chart, labs and discussed the procedure including the risks, benefits and alternatives for the proposed anesthesia with the patient or authorized representative who has indicated his/her understanding and acceptance.   Dental advisory given  Plan Discussed with: CRNA and Anesthesiologist  Anesthesia Plan Comments:         Anesthesia Quick Evaluation

## 2018-03-29 ENCOUNTER — Encounter: Payer: Self-pay | Admitting: Pulmonary Disease

## 2018-03-29 NOTE — Anesthesia Postprocedure Evaluation (Signed)
Anesthesia [name] Note  Patient: Christopher [name]  Procedure(s) Performed: BRONCHOSCOPY WITH NAVIGATION AND CELLVIZIO (Left )  Patient location during evaluation: PACU Anesthesia Type: General Level of consciousness: awake and alert and oriented Pain management: pain level controlled Vital Signs Assessment: [name]-procedure vital signs reviewed and stable Respiratory status: spontaneous breathing, nonlabored ventilation and respiratory function stable Cardiovascular status: blood pressure returned to baseline and stable Postop Assessment: no signs of nausea or vomiting Anesthetic complications: no     Last Vitals:  Vitals:   03/28/18 1515 03/28/18 1528  BP: (!) 147/80 (!) 146/75  Pulse: 66 62  Resp: (!) 25 16  Temp: 36.8 C (!) 36.1 C  SpO2: 97% 95%    Last Pain:  Vitals:   03/29/18 0811  TempSrc:   PainSc: 1                  Jase Himmelberger

## 2018-03-29 NOTE — Telephone Encounter (Signed)
Patient had his Bronchoscopy done yesterday 03/28/2018. He then has an appointment with Dr. Genevive Bi on 04/14/2018 to discuss results per Dr. Genevive Bi. Patient will be notified.

## 2018-03-30 LAB — SURGICAL PATHOLOGY

## 2018-03-30 LAB — CYTOLOGY - NON PAP

## 2018-03-31 LAB — CULTURE, BAL-QUANTITATIVE W GRAM STAIN
Culture: NO GROWTH
Gram Stain: NONE SEEN

## 2018-03-31 LAB — CULTURE, BAL-QUANTITATIVE: SPECIAL REQUESTS: NORMAL

## 2018-04-04 ENCOUNTER — Encounter: Payer: Self-pay | Admitting: Pulmonary Disease

## 2018-04-04 ENCOUNTER — Telehealth: Payer: Self-pay | Admitting: *Deleted

## 2018-04-04 ENCOUNTER — Ambulatory Visit: Payer: Medicare Other | Admitting: Pulmonary Disease

## 2018-04-04 VITALS — BP 118/78 | HR 67 | Ht 69.0 in | Wt 216.0 lb

## 2018-04-04 DIAGNOSIS — R918 Other nonspecific abnormal finding of lung field: Secondary | ICD-10-CM | POA: Diagnosis not present

## 2018-04-04 DIAGNOSIS — R911 Solitary pulmonary nodule: Secondary | ICD-10-CM | POA: Diagnosis not present

## 2018-04-04 NOTE — Telephone Encounter (Signed)
Pt informed that will be cancelling appts in December for CT scan and follow up with Dr. Grayland Ormond since pt will have further follow up of lung nodule scheduled with Dr. Patsey Berthold. Pt verbalized understanding.

## 2018-04-04 NOTE — Progress Notes (Signed)
   Subjective:    Patient ID: Christopher [name], male    DOB: March 16, 1946, 72 y.o.   MRN: [id number]  HPI The patient presents today for follow-up after he had Electromagnetic Navigation Bronchoscopy (ENB) performed on 29 October. He had biopsies performed of a complex lung nodule which has been varying in size. Biopsy show focal fibrosis and cluster of cells that included macrophages and eosinophils consistent with chronic inflammation. This of course will need to be followed up expectantly. Culture bronchoalveolar lavage was negative.  Since his procedure the patient has had no issues with dyspnea, cough, chest pain, orthopnea, paroxysmal nocturnal dyspnea or lower extremity edema. He has been doing well. Voices no other complaint. The patient was concerned about the term "fibrosis" used on the biopsy he was concerned that this would indicate interstitial fibrosis however this is not the case. We explain this to the patient and showed him his CT scans again so that he could understand the process. He was allowed to ask questions and these were answered to his satisfaction.   Review of Systems  Constitutional: Negative.   HENT: Negative.   Respiratory: Negative.   Cardiovascular: Negative.   Allergic/Immunologic: Negative.   Hematological: Negative.   All other systems reviewed and are negative.      Objective:   Physical Exam  Constitutional: He is oriented to person, place, and time. He appears well-developed and well-nourished. No distress.  HENT:  Head: Normocephalic.  Eyes: Pupils are equal, round, and reactive to light. Conjunctivae are normal. No scleral icterus.  Neck: Neck supple.  Cardiovascular: Normal rate, regular rhythm and normal heart sounds.  Pulmonary/Chest: Effort normal and breath sounds normal. No respiratory distress.  Abdominal: Soft. He exhibits no distension.  Musculoskeletal: He exhibits no edema.  Neurological: He is alert and oriented to person, place, and  time.  Skin: Skin is warm and dry. He is not diaphoretic.  Psychiatric: He has a normal mood and affect. His behavior is normal. Judgment and thought content normal.    Pathology reports, CT scans were reviewed again with the patient in detail.      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 There is low level FDG activity. CT scan of the chest performed on 3 October for navigation mapping, showed that these lesions were decreasing in size. Biopsy performed on 29 October was consistent with inflammatory change/scarring. Recommend to follow this expectantly. Patient will be seen in six months time with a chest CT at that time. Patient is to contact us prior to that time should any new difficulties arise.

## 2018-04-04 NOTE — Patient Instructions (Signed)
1) we discussed results of the biopsy. This is consistent with inflammation.  2) will perform a CT scan of the chest in six months and will follow up at that time.

## 2018-04-10 ENCOUNTER — Encounter: Payer: Self-pay | Admitting: Cardiothoracic Surgery

## 2018-04-10 ENCOUNTER — Ambulatory Visit (INDEPENDENT_AMBULATORY_CARE_PROVIDER_SITE_OTHER): Payer: Medicare Other | Admitting: Cardiothoracic Surgery

## 2018-04-10 ENCOUNTER — Other Ambulatory Visit: Payer: Self-pay

## 2018-04-10 VITALS — BP 147/83 | HR 59 | Temp 97.7°F | Ht 69.0 in | Wt 214.0 lb

## 2018-04-10 DIAGNOSIS — R918 Other nonspecific abnormal finding of lung field: Secondary | ICD-10-CM

## 2018-04-10 NOTE — Patient Instructions (Signed)
Return as needed.The patient is aware to call back for any questions or concerns.  

## 2018-04-10 NOTE — Progress Notes (Signed)
  Patient ID: Christopher [name], male   DOB: [birth year], 72 y.o.   MRN: [id number]  HISTORY: He returns today in follow-up.  He underwent a bronchoscopy with navigational biopsy of his left upper lobe mass.  That returned nonspecific interstitial fibrosis.  He states that he did well with the biopsy although he has developed a slight cough.  He also had some labs checked last week and was found to be hypothyroid with an elevated TSH.  He will seek follow-up for that with his endocrinologist.  He states that he did had no complications with the biopsy.   Vitals:   04/10/18 0802  BP: (!) 147/83  Pulse: (!) 59  Temp: 97.7 F (36.5 C)  SpO2: 97%     EXAM:    Resp: Lungs are clear bilaterally.  No respiratory distress, normal effort. Heart:  Regular without murmurs Abd:  Abdomen is soft, non distended and non tender. No masses are palpable.  There is no rebound and no guarding.  Neurological: Alert and oriented to person, place, and time. Coordination normal.  Skin: Skin is warm and dry. No rash noted. No diaphoretic. No erythema. No pallor.  Psychiatric: Normal mood and affect. Normal behavior. Judgment and thought content normal.    ASSESSMENT: I have independently reviewed the patient's pathology report.  There is nonspecific inflammation.  Whether or not this is entirely representative of the area in question will be followed with serial CT scans.  The patient will follow up with Dr. Patsey Berthold in pulmonary medicine.   PLAN:   I will see the patient as needed.  He feels comfortable with his follow-up.  No plans for surgical resection at this time.    Nestor Lewandowsky, MD

## 2018-04-14 ENCOUNTER — Ambulatory Visit: Payer: Medicare Other | Admitting: Cardiothoracic Surgery

## 2018-05-11 ENCOUNTER — Other Ambulatory Visit: Payer: Medicare Other

## 2018-05-11 ENCOUNTER — Ambulatory Visit: Payer: Medicare Other

## 2018-05-19 ENCOUNTER — Ambulatory Visit: Payer: Medicare Other | Admitting: Oncology

## 2018-10-10 ENCOUNTER — Ambulatory Visit
Admission: RE | Admit: 2018-10-10 | Discharge: 2018-10-10 | Disposition: A | Discharge status: Home / Self Care | Payer: Medicare Other | Source: Ambulatory Visit | Attending: Pulmonary Disease | Admitting: Pulmonary Disease

## 2018-10-10 ENCOUNTER — Other Ambulatory Visit: Payer: Self-pay

## 2018-10-10 DIAGNOSIS — R918 Other nonspecific abnormal finding of lung field: Secondary | ICD-10-CM | POA: Diagnosis present

## 2018-10-11 ENCOUNTER — Encounter: Payer: Self-pay | Admitting: Pulmonary Disease

## 2018-10-11 ENCOUNTER — Ambulatory Visit (INDEPENDENT_AMBULATORY_CARE_PROVIDER_SITE_OTHER): Payer: Medicare Other | Admitting: Pulmonary Disease

## 2018-10-11 DIAGNOSIS — R0982 Postnasal drip: Secondary | ICD-10-CM | POA: Diagnosis not present

## 2018-10-11 DIAGNOSIS — R059 Cough, unspecified: Secondary | ICD-10-CM

## 2018-10-11 DIAGNOSIS — R911 Solitary pulmonary nodule: Secondary | ICD-10-CM

## 2018-10-11 DIAGNOSIS — R05 Cough: Secondary | ICD-10-CM | POA: Diagnosis not present

## 2018-10-11 NOTE — Patient Instructions (Signed)
F/U in 3 months  CT chest, Super D, in 3 months

## 2018-10-11 NOTE — Progress Notes (Addendum)
   Subjective:    Patient ID: Christopher [name], male    DOB: 28-Nov-1945, 73 y.o.   MRN: [id number]    COVID-19 CAVEAT: Patient has consented to virtual visit via telephone/video due to current pandemic situation with COVID-19 and need for social distancing.  Patient understands that diagnostic capacity will be reduced due to the inability to perform real-life physical examination.  Patient agreed to proceed.  Two identifiers were used to ensure correct patient identity.  HPI  The patient is a 73 year old former smoker, with follow-up today via telephone visit for a complex left upper lobe nodule.  The patient underwent electromagnetic Navigation Bronchoscopy (ENB) on 28 March 2018. He had biopsies performed of this complex lung nodule which has been varying in size. Biopsy showed focal fibrosis and cluster of cells that included macrophages and eosinophils consistent with chronic inflammation. Cultures of the bronchoalveolar lavage were negative.  He had a CT scan of the chest performed yesterday 5/12.  This shows clustered noncalcified nodules of the left upper lobe that have not changed significantly in size.  No other new findings on the CT scan.  I have reviewed this independently and have conveyed the results to the patient.  Since his last visit on 04 April 2018, he has not had any new respiratory symptoms.  He has had a cough that he relates to postnasal drip which has worsened for him during allergy season.  He has not had a fever, chills or sweats.  No sputum production no hemoptysis.  Weight and appetite are stable.  No chest pain, no orthopnea or paroxysmal nocturnal dyspnea.  No lower extremity edema.    Review of Systems  Constitutional: Negative.   HENT: Positive for postnasal drip.   Respiratory: Positive for cough (Nonproductive relates to postnasal drip). Negative for chest tightness, shortness of breath and wheezing.   Cardiovascular: Negative.   Gastrointestinal: Negative.    Endocrine: Negative.   Genitourinary: Negative.   Allergic/Immunologic: Positive for environmental allergies.  Neurological: Negative.   All other systems reviewed and are negative.      Objective:   Physical Exam  Physical exam not performed as this visit was performed via telephone consultation.      Assessment & Plan:  1. 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 CT scan of the chest performed on of May 2020, showed that these lesions are stable in size. Biopsy performed on 28 March 2018 was consistent with inflammatory change/scarring. Recommend to follow this expectantly. Patient will be seen in 3 months time with a chest CT at that time. Patient is to contact us prior to that time should any new difficulties arise.  2.  Postnasal drip: Patient was instructed on nasal hygiene and use of either Allegra or Claritin over-the-counter.  3.  Upper airway cough syndrome: Related to #2 above measures as per #2 above.  Total time of visit 18 minutes.

## 2018-11-06 ENCOUNTER — Telehealth: Payer: Self-pay | Admitting: Pulmonary Disease

## 2018-11-06 DIAGNOSIS — R918 Other nonspecific abnormal finding of lung field: Secondary | ICD-10-CM

## 2018-11-06 NOTE — Telephone Encounter (Addendum)
Called and spoke to pt.  Pt stated that he received a bill from [facility] in November for bronch that was performed in Oct. Pt stated that he paid that bill, however he received another bill today from [place] stated he reached out to [facility] and hospital billing and was advised to contact our office. Pt stated that bill mentions left lower lobe and the bronch was performed on left upper lode.  Also he is questioning the 62mo CT, as he was advised that nothing had changed on his recent CT.   Kathlee Nations can you help with this? LD please advise on CT. Thanks

## 2018-11-07 NOTE — Telephone Encounter (Signed)
LM for patient to call regarding CT. CT without contrast entered for November 2020 per Dr. Patsey Berthold.

## 2018-11-07 NOTE — Telephone Encounter (Signed)
All of my specimens are correctly labelled LEFT UPPER LOBE. All cytology, biopsies,aspirates and cultures. The error is not with [place] but with [facility]. I have no control over [place].

## 2018-11-07 NOTE — Telephone Encounter (Signed)
The CT order is old. He does not need one for 6 mo from his llast one in MAY

## 2018-11-08 NOTE — Telephone Encounter (Signed)
I spoke to patient regarding a bill he got from [place] for a bronchoscopy procedure. I have advised him as well as I can, he received a bill in November which he paid, then he is stating that he received another bill this week for the same specimens. He called [facility] and they advised him to call the office. I have sent a message on to [place] to look into this.   Also spoke to patient regarding CT scan, that Dr. Patsey Berthold wanted a 6 month f/u scan. He is aware.

## 2018-11-16 NOTE — Telephone Encounter (Signed)
Attempted to call patient, left msg on voicemail

## 2018-12-08 NOTE — Telephone Encounter (Signed)
Liz please advise. Thanks  

## 2018-12-08 NOTE — Telephone Encounter (Signed)
Pt is requesting update via mychart.  Kathlee Nations, can you assist with this. Thanks.

## 2018-12-14 ENCOUNTER — Telehealth: Payer: Self-pay | Admitting: Pulmonary Disease

## 2018-12-14 NOTE — Telephone Encounter (Signed)
Pt is requesting call from Kathlee Nations to discuss this matter further.   Kathlee Nations please advise. Thanks

## 2018-12-14 NOTE — Telephone Encounter (Signed)
Attempted to call patient, no answer, left message on his voicemail with this office number and my name.

## 2018-12-18 NOTE — Telephone Encounter (Signed)
Christopher [name], please advise on this as pt is returning a call he received from you.

## 2018-12-20 NOTE — Telephone Encounter (Signed)
Liz, please advise. Thanks 

## 2018-12-21 NOTE — Telephone Encounter (Signed)
6 months is fine

## 2018-12-21 NOTE — Telephone Encounter (Addendum)
Dr. Patsey Berthold, can you verify if CT is in fact needed 16mo from last CT? It was order for 53mo, however pt is questioning if it could be 78mo? There is a mychart encounter regarding this matter, however it stated that pt does not need CT in 52mo, but CT was ordered for 04/2019. Was this a typo?  (above message has been sent to [place] in 12/14/2018 phone note)

## 2018-12-21 NOTE — Telephone Encounter (Addendum)
confirmed with LG that CT needs to be in 69mo.  CT is pending for 04/2019. Pt has been made aware of this information.  OV for 01/17/2019 has been canceled, until after CT per pt request.  Recall has been placed for 04/2019. Nothing further is needed at this time.

## 2018-12-21 NOTE — Telephone Encounter (Addendum)
Dr. Patsey Berthold, can you verify if CT is in fact needed 96mo from last CT? It was order for 37mo, however pt is questioning if it could be 52mo? There is a mychart encounter regarding this matter, however it stated that pt does not need CT in 26mo, but CT was ordered for 04/2019. Was this a typo?

## 2018-12-21 NOTE — Telephone Encounter (Signed)
Called patient back.  He has several issues.  One is the bill from [place] for the [facility] bronch specimen.  He will find his [facility] bill and message me back with the [facility] phone number and his invoice number.   The second issue is he is still scheduled for a follow up visit in August, when It looks like he should be scheduled for 6 months from May with a  CT.  Will route this message back to [facility] to look into his appointment and CT.

## 2018-12-22 NOTE — Telephone Encounter (Signed)
Christopher [name], pt provided below information via mychart and asked that it get forward to you.   Please forward this on to [place]..  [facility] bill dated 04/14/18.Christopher [name] Number [id number] Insurance filed: [employer] [place] [employer] [place] bill dated 10/27/18.Christopher [name] Number [id number] Insurance filed: [employer] [employer]    Thanks for checking this out.  Christopher [name]

## 2018-12-22 NOTE — Telephone Encounter (Signed)
Patient has order for CT scan deferred to November (6 months from last CT) as well as a recall placed for OV to follow CT (November 2020). Apt for August 2020 has been cancelled.

## 2018-12-25 NOTE — Telephone Encounter (Signed)
Please see telephone encounter from 11/06/2018 regarding billing issue.

## 2018-12-26 NOTE — Telephone Encounter (Signed)
Christopher [name] is working on this.

## 2018-12-29 NOTE — Telephone Encounter (Signed)
Christopher [name] states she would work on this 7/31

## 2019-01-02 NOTE — Telephone Encounter (Signed)
Attempted to call [facility], spoke with [place], she looked at both invoices, but could offer no reasons why he had two bills, and just kept telling me she was "not medically trained", stated she had to transfer me to client services, after holding for forty minutes, the person who answered the phone stated she could not offer any help and I would need to be transferred back to billing and I should ask for a supervisor.  Did not have any more time today for this issue.  Will attempt again tomorrow.

## 2019-01-03 NOTE — Telephone Encounter (Signed)
Christopher [name] is still currently working on this as encounter from 11/06/2018 has an updated note from [place] dated 8/4

## 2019-01-17 ENCOUNTER — Ambulatory Visit: Payer: Medicare Other | Admitting: Pulmonary Disease

## 2019-01-17 NOTE — Telephone Encounter (Signed)
Called [facility] back, spoke with Shauna, (call took over 30 minutes) she contacted her customer service department who could not help her.  She looked at the account and stated it looked like [facility] sent two different specimens.  She attempted to call [facility] path lab, but they are all in training today.  She gave me their direct line, [phone], which I will try to call tomorrow when hopefully they are all back in the office.  I called Christopher [name] and updated him on the progress and what my next steps were.

## 2019-01-17 NOTE — Telephone Encounter (Signed)
Spoke with Kathlee Nations she is going to touch base with patient today 01/17/19

## 2019-01-18 NOTE — Telephone Encounter (Signed)
OK, called back to [facility], spoke with Broadus John who finally made sense of this.  Dr. Duwayne Heck sent out three specimens from the one lobe, [employer] paid for two of the specimens, and the patient was billed for the co-insurance on those.  The third specimen was denied.  [facility] filed an appeal, which at first went out with the wrong insurance information, but eventually they got the 3rd specimen paid, and that was the second bill the patient recd from them.  Called the patient back, explained this to him.  He was satisfied with this information.

## 2019-01-18 NOTE — Telephone Encounter (Signed)
Copied from another phone note that I am closing so there will only be one phone note for this issue.  Called [facility] back, spoke with Shauna, (call took over 30 minutes) she contacted her customer service department who could not help her.  She looked at the account and stated it looked like [facility] sent two different specimens.  She attempted to call [facility] path lab, but they are all in training today.  She gave me their direct line, [phone], which I will try to call tomorrow when hopefully they are all back in the office.  I called Christopher [name] and updated him on the progress and what my next steps were.

## 2019-01-18 NOTE — Telephone Encounter (Signed)
Called pathology.  They sent out three specimens all on the date of the procedure.  No further specimens were sent out later for this patient.  Will attempt to call [facility] back.    [facility] bill dated 04/14/18.Christopher [name] Number [id number] Insurance filed: [employer] [place] [employer] [place] bill dated 10/27/18.Christopher [name] Number [id number] Insurance filed: [employer] [employer]

## 2019-05-16 ENCOUNTER — Other Ambulatory Visit: Payer: Self-pay | Admitting: Pulmonary Disease

## 2019-05-16 ENCOUNTER — Other Ambulatory Visit: Payer: Self-pay

## 2019-05-16 ENCOUNTER — Ambulatory Visit
Admission: RE | Admit: 2019-05-16 | Discharge: 2019-05-16 | Disposition: A | Discharge status: Home / Self Care | Payer: Medicare Other | Source: Ambulatory Visit | Attending: Pulmonary Disease | Admitting: Pulmonary Disease

## 2019-05-16 DIAGNOSIS — R918 Other nonspecific abnormal finding of lung field: Secondary | ICD-10-CM | POA: Insufficient documentation

## 2019-05-29 ENCOUNTER — Ambulatory Visit: Payer: Medicare Other | Admitting: Pulmonary Disease

## 2019-09-19 ENCOUNTER — Encounter: Payer: Medicare Other | Admitting: Dermatology

## 2019-10-01 ENCOUNTER — Other Ambulatory Visit: Payer: Self-pay

## 2019-10-01 ENCOUNTER — Ambulatory Visit
Admission: EM | Admit: 2019-10-01 | Discharge: 2019-10-01 | Disposition: A | Discharge status: Home / Self Care | Payer: Medicare Other | Attending: Family Medicine | Admitting: Family Medicine

## 2019-10-01 DIAGNOSIS — Z87891 Personal history of nicotine dependence: Secondary | ICD-10-CM | POA: Diagnosis not present

## 2019-10-01 DIAGNOSIS — Z7901 Long term (current) use of anticoagulants: Secondary | ICD-10-CM | POA: Diagnosis not present

## 2019-10-01 DIAGNOSIS — G4733 Obstructive sleep apnea (adult) (pediatric): Secondary | ICD-10-CM

## 2019-10-01 DIAGNOSIS — E119 Type 2 diabetes mellitus without complications: Secondary | ICD-10-CM | POA: Diagnosis not present

## 2019-10-01 DIAGNOSIS — R040 Epistaxis: Secondary | ICD-10-CM | POA: Diagnosis not present

## 2019-10-01 DIAGNOSIS — Z801 Family history of malignant neoplasm of trachea, bronchus and lung: Secondary | ICD-10-CM | POA: Insufficient documentation

## 2019-10-01 DIAGNOSIS — I2510 Atherosclerotic heart disease of native coronary artery without angina pectoris: Secondary | ICD-10-CM | POA: Diagnosis not present

## 2019-10-01 DIAGNOSIS — Z8249 Family history of ischemic heart disease and other diseases of the circulatory system: Secondary | ICD-10-CM | POA: Diagnosis not present

## 2019-10-01 DIAGNOSIS — Z888 Allergy status to other drugs, medicaments and biological substances status: Secondary | ICD-10-CM | POA: Diagnosis not present

## 2019-10-01 DIAGNOSIS — Z20822 Contact with and (suspected) exposure to covid-19: Secondary | ICD-10-CM | POA: Diagnosis not present

## 2019-10-01 DIAGNOSIS — I480 Paroxysmal atrial fibrillation: Secondary | ICD-10-CM | POA: Diagnosis not present

## 2019-10-01 DIAGNOSIS — Z823 Family history of stroke: Secondary | ICD-10-CM | POA: Diagnosis not present

## 2019-10-01 DIAGNOSIS — E039 Hypothyroidism, unspecified: Secondary | ICD-10-CM | POA: Diagnosis not present

## 2019-10-01 DIAGNOSIS — Z880 Allergy status to penicillin: Secondary | ICD-10-CM | POA: Diagnosis not present

## 2019-10-01 DIAGNOSIS — Z79899 Other long term (current) drug therapy: Secondary | ICD-10-CM | POA: Insufficient documentation

## 2019-10-01 DIAGNOSIS — J329 Chronic sinusitis, unspecified: Secondary | ICD-10-CM | POA: Diagnosis not present

## 2019-10-01 DIAGNOSIS — Z9989 Dependence on other enabling machines and devices: Secondary | ICD-10-CM

## 2019-10-01 DIAGNOSIS — I10 Essential (primary) hypertension: Secondary | ICD-10-CM | POA: Diagnosis not present

## 2019-10-01 DIAGNOSIS — R519 Headache, unspecified: Secondary | ICD-10-CM | POA: Diagnosis present

## 2019-10-01 DIAGNOSIS — Z7984 Long term (current) use of oral hypoglycemic drugs: Secondary | ICD-10-CM | POA: Insufficient documentation

## 2019-10-01 MED ORDER — AZITHROMYCIN 250 MG PO TABS: ORAL_TABLET | ORAL | 0 refills | Status: AC

## 2019-10-01 NOTE — Discharge Instructions (Addendum)
-  Azithromycin: Take 2 tablets by mouth the first day followed by one tablet daily for next 4 days. --Over-the-counter Afrin.  Will help open up the nasal passages as well as treat any nosebleeds. -Nasal saline rinse to Help keep nasal passages moist and less likely to bleed. -Recommend frequent cleaning of your CPAP until symptoms are improved. -Follow-up with primary care provider as needed.

## 2019-10-01 NOTE — ED Provider Notes (Signed)
[facility]    CSN: [id number] Arrival date & time: 10/01/19  1354      History   Chief Complaint Chief Complaint  Patient presents with  . Sinus Problem    HPI Christopher [name] is a 74 y.o. male.   Patient is a 74 year old male who presents with complaint of possible sinus traction for about 2 weeks.  Patient states he does have a history of sleep apnea and uses CPAP nightly.  He states typically he will get some sinus pain and pressure symptoms in the last about 2 weeks and then similarly get better and then get worse again all of a sudden.  Patient states his symptoms not improve the last 2 weeks.  He states he developed jaw and teeth pain on the right yesterday and is also noticed the pain behind his right eye.  He reports a nosebleed yesterday and this morning.  He states he cleans his CPAP machine about every couple weeks.  He has been taken Mucinex and Alka-Seltzer.  He does report a temperature nine 9.5 last night.  Patient denies any chest pain, shortness of breath, abdominal pain.  He does report an allergy to PCN and another medication he cannot remember.     Past Medical History:  Diagnosis Date  . Acid reflux   . Arthritis   . Atrial fibrillation ([place])   . BPH (benign prostatic hyperplasia)   . CAD (coronary artery disease)   . Colon adenomas   . Disc displacement, lumbar   . Diverticulosis   . DM (diabetes mellitus) ([place])   . Dysrhythmia   . ED (erectile dysfunction)   . Gross hematuria   . History of kidney stones   . HLD (hyperlipidemia)   . HTN (hypertension)   . Hydronephrosis   . Hypothyroidism   . Kidney stones   . OSA (obstructive sleep apnea)   . Over weight   . Paresthesia of foot   . Peripheral neuropathy   . Peripheral neuropathy   . Renal insufficiency   . Skin cancer   . Sleep apnea   . Thyroid disease     Patient Active Problem List   Diagnosis Date Noted  . Incidental lung nodule, greater than or equal to 8m 04/04/2018    . Coronary artery disease involving native coronary artery of native heart 03/07/2018  . Pulmonary nodule 02/17/2018  . Bilateral carotid artery stenosis 11/26/2016  . Cataract 01/28/2015  . Diabetes mellitus, type 2 ([place] 11/26/2014  . Discontinued smoking 11/26/2014  . Combined fat and carbohydrate induced hyperlipemia 11/26/2014  . Benign essential HTN 11/01/2014  . AF (paroxysmal atrial fibrillation) ([place] 05/16/2014  . Obstructive apnea 01/10/2014  . Adult hypothyroidism 10/10/2013    Past Surgical History:  Procedure Laterality Date  . BASAL CELL SKIN CANCER RESECTION     back of neck behind the ears  . CARDIAC CATHETERIZATION     no stents  . CATARACT EXTRACTION     both eyes sperate times  . COLONOSCOPY N/A 03/26/2016   Procedure: COLONOSCOPY;  Surgeon: MLollie Sails MD;  [facility];  Service: Endoscopy;  Laterality: N/A;  . COLONOSCOPY WITH PROPOFOL N/A 07/08/2016   Procedure: COLONOSCOPY WITH PROPOFOL;  Surgeon: MLollie Sails MD;  [facility];  Service: Endoscopy;  Laterality: N/A;  . ESOPHAGOGASTRODUODENOSCOPY (EGD) WITH PROPOFOL N/A 03/26/2016   Procedure: ESOPHAGOGASTRODUODENOSCOPY (EGD) WITH PROPOFOL;  Surgeon: MLollie Sails MD;  [facility];  Service: Endoscopy;  Laterality:  N/A;  Diabetic  . ESOPHAGOGASTRODUODENOSCOPY (EGD) WITH PROPOFOL N/A 09/06/2017   Procedure: ESOPHAGOGASTRODUODENOSCOPY (EGD) WITH PROPOFOL;  Surgeon: [name], MD;  Location: [facility] ENDOSCOPY;  Service: Endoscopy;  Laterality: N/A;  . EYE SURGERY    . FLEXIBLE BRONCHOSCOPY Left 03/28/2018   Procedure: BRONCHOSCOPY WITH NAVIGATION AND CELLVIZIO;  Surgeon: Tyler Pita, MD;  Location: [facility] ORS;  Service: Cardiopulmonary;  Laterality: Left;  . HERNIA REPAIR     umbilical  . NASAL SINUS SURGERY    . TONSILLECTOMY         Home Medications    Prior to Admission medications   Medication Sig Start Date End Date Taking?  Authorizing Provider  acetaminophen (TYLENOL) 325 MG tablet Take 650 mg by mouth at bedtime.   Yes [provider]  amLODipine (NORVASC) 5 MG tablet Take 5 mg by mouth daily.  07/01/14  Yes [provider]  apixaban (ELIQUIS) 5 MG TABS tablet Take 5 mg by mouth 2 (two) times daily.  09/29/14  Yes [provider]  atorvastatin (LIPITOR) 40 MG tablet Take 40 mg by mouth daily.   Yes [provider]  carvedilol (COREG) 25 MG tablet Take 25 mg by mouth 2 (two) times daily with a meal.   Yes [provider]  doxazosin (CARDURA) 4 MG tablet Take 4 mg by mouth daily.  10/29/14  Yes [provider]  empagliflozin (JARDIANCE) 25 MG TABS tablet Take 25 mg by mouth daily.   Yes [provider]  finasteride (PROSCAR) 5 MG tablet Take 5 mg by mouth daily.  08/12/14  Yes [provider]  flecainide (TAMBOCOR) 50 MG tablet Take 50 mg by mouth 2 (two) times daily.  09/02/14 10/01/19 Yes [provider]  fluticasone (FLONASE) 50 MCG/ACT nasal spray Place 2 sprays into both nostrils daily as needed for allergies.  10/29/14  Yes [provider]  glipiZIDE (GLUCOTROL XL) 10 MG 24 hr tablet Take 10 mg by mouth 2 (two) times daily.  08/12/14  Yes [provider]  glucose blood (ONE TOUCH ULTRA TEST) test strip TEST twice a day or as directed 03/11/14  Yes [provider]  lisinopril (PRINIVIL,ZESTRIL) 20 MG tablet Take 20 mg by mouth 2 (two) times daily.  10/29/14  Yes [provider]  pantoprazole (PROTONIX) 40 MG tablet Take 40 mg by mouth daily.  09/30/14  Yes [provider]  pioglitazone (ACTOS) 30 MG tablet Take 30 mg by mouth daily.  06/19/14  Yes [provider]  tamsulosin (FLOMAX) 0.4 MG CAPS capsule Take 0.4 mg by mouth daily.  09/26/14  Yes [provider]  azithromycin (ZITHROMAX Z-PAK) 250 MG tablet Take 2 tablets by mouth the first day followed by one tablet daily for next 4 days.  10/01/19   Luvenia Redden, PA-C  levothyroxine (SYNTHROID) 88 MCG tablet Take by mouth. 04/12/18 04/12/19  [provider]    Family History Family History  Problem Relation Age of Onset  . Stroke Mother   . Heart attack Mother   . Heart attack Father   . Lung cancer Father   . Cancer Brother   . Lung cancer Cousin   . Prostate cancer Neg Hx   . Kidney cancer Neg Hx   . Bladder Cancer Neg Hx     Social History Social History   Tobacco Use  . Smoking status: Former Smoker    Packs/day: 2.00    Types: Cigarettes    Quit date: 05/31/1986  Years since quitting: 33.3  . Smokeless tobacco: Never Used  Substance Use Topics  . Alcohol use: Yes    Alcohol/week: 0.0 standard drinks    Comment: socially  . Drug use: No     Allergies   Etodolac and Penicillins   Review of Systems Review of Systems as noted above in HPI.  Other systems reviewed and found to be negative.   Physical Exam Triage Vital Signs ED Triage Vitals  Enc Vitals Group     BP 10/01/19 1433 139/82     Pulse Rate 10/01/19 1433 62     Resp 10/01/19 1433 18     Temp 10/01/19 1433 98.1 F (36.7 C)     Temp src --      SpO2 10/01/19 1433 97 %     Weight --      Height --      Head Circumference --      Peak Flow --      Pain Score 10/01/19 1429 2     Pain Loc --      Pain Edu? --      Excl. in [place]? --    No data found.  Updated Vital Signs BP 139/82 (BP Location: Left Arm)   Pulse 62   Temp 98.1 F (36.7 C)   Resp 18   SpO2 97%   Physical Exam Constitutional:      General: He is not in acute distress.    Appearance: Normal appearance. He is not ill-appearing.  HENT:     Head: Normocephalic and atraumatic.     Right Ear: Tympanic membrane and ear canal normal.     Left Ear: Tympanic membrane and ear canal normal.     Nose: Congestion present.     Right Turbinates: Swollen.     Left Turbinates: Not swollen.     Right Sinus: Maxillary sinus tenderness and frontal sinus  tenderness present.     Left Sinus: No maxillary sinus tenderness or frontal sinus tenderness.     Mouth/Throat:     Mouth: Mucous membranes are moist.     Pharynx: Oropharynx is clear.     Comments: Some clear post nasal drainage  Cardiovascular:     Rate and Rhythm: Normal rate and regular rhythm.     Pulses: Normal pulses.     Heart sounds: Normal heart sounds.  Pulmonary:     Effort: Pulmonary effort is normal. No respiratory distress.     Breath sounds: Normal breath sounds. No wheezing or rhonchi.  Neurological:     General: No focal deficit present.     Mental Status: He is alert and oriented to person, place, and time.      UC Treatments / Results  Labs (all labs ordered are listed, but only abnormal results are displayed) Labs Reviewed  SARS CORONAVIRUS 2 (TAT 6-24 HRS)    EKG   Radiology No results found.  Procedures Procedures (including critical care time)  Medications Ordered in UC Medications - No data to display  Initial Impression / Assessment and Plan / UC Course  I have reviewed the triage vital signs and the nursing notes.  Pertinent labs & imaging results that were available during my care of the patient were reviewed by me and considered in my medical decision making (see chart for details).    Patient with symptoms and exam concerning for possible sinus infection.  Patient with some periauricular tenderness on the right as well as mild maxillary and  frontal sinus tenderness.  Some clear postnasal drip.  Ear exam is normal.  Some swelling of the turbinates.  Given patient's history and CPAP use we will go ahead and give him a prescription for azithromycin.  Also recommend Afrin nasal spray to help with the inflammation and nosebleeds.  Can also use saline rinse to help keep the nose moist and and less likely to bleed. Final Clinical Impressions(s) / UC Diagnoses   Final diagnoses:  Sinusitis, unspecified chronicity, unspecified location  OSA on  CPAP  Right-sided nosebleed     Discharge Instructions     -Azithromycin: Take 2 tablets by mouth the first day followed by one tablet daily for next 4 days. --Over-the-counter Afrin.  Will help open up the nasal passages as well as treat any nosebleeds. -Nasal saline rinse to Help keep nasal passages moist and less likely to bleed. -Recommend frequent cleaning of your CPAP until symptoms are improved. -Follow-up with primary care provider as needed.    ED Prescriptions    Medication Sig Dispense Auth. Provider   azithromycin (ZITHROMAX Z-PAK) 250 MG tablet Take 2 tablets by mouth the first day followed by one tablet daily for next 4 days. 6 tablet Luvenia Redden, PA-C     PDMP not reviewed this encounter.   Luvenia Redden, PA-C 10/01/19 1517

## 2019-10-01 NOTE — ED Triage Notes (Signed)
Pt reports 99.5 temp last night, nasal congestion, nosebleed on R side this morning, pressure above R eye and down temple.  Increase in sinus pressure x 2 weeks.  Uses a CPAP at night.  Feels he is developing a sinus infection.  Has had Pfizer's COVID vaccine x 2 but is ok with having a COVID test today.

## 2019-10-02 LAB — SARS CORONAVIRUS 2 (TAT 6-24 HRS): SARS Coronavirus 2: NEGATIVE

## 2019-11-23 ENCOUNTER — Ambulatory Visit
Admission: RE | Admit: 2019-11-23 | Discharge: 2019-11-23 | Disposition: A | Discharge status: Home / Self Care | Payer: Medicare Other | Source: Ambulatory Visit | Attending: Pulmonary Disease | Admitting: Pulmonary Disease

## 2019-11-23 ENCOUNTER — Other Ambulatory Visit: Payer: Self-pay

## 2019-11-23 DIAGNOSIS — R918 Other nonspecific abnormal finding of lung field: Secondary | ICD-10-CM

## 2019-12-10 ENCOUNTER — Ambulatory Visit: Payer: Medicare Other | Admitting: Dermatology

## 2019-12-10 ENCOUNTER — Other Ambulatory Visit: Payer: Self-pay

## 2019-12-10 DIAGNOSIS — Z1283 Encounter for screening for malignant neoplasm of skin: Secondary | ICD-10-CM | POA: Diagnosis not present

## 2019-12-10 DIAGNOSIS — L821 Other seborrheic keratosis: Secondary | ICD-10-CM

## 2019-12-10 DIAGNOSIS — D229 Melanocytic nevi, unspecified: Secondary | ICD-10-CM

## 2019-12-10 DIAGNOSIS — D1800 Hemangioma unspecified site: Secondary | ICD-10-CM

## 2019-12-10 DIAGNOSIS — D2239 Melanocytic nevi of other parts of face: Secondary | ICD-10-CM | POA: Diagnosis not present

## 2019-12-10 DIAGNOSIS — I872 Venous insufficiency (chronic) (peripheral): Secondary | ICD-10-CM

## 2019-12-10 DIAGNOSIS — L578 Other skin changes due to chronic exposure to nonionizing radiation: Secondary | ICD-10-CM

## 2019-12-10 DIAGNOSIS — Z85828 Personal history of other malignant neoplasm of skin: Secondary | ICD-10-CM

## 2019-12-10 DIAGNOSIS — D492 Neoplasm of unspecified behavior of bone, soft tissue, and skin: Secondary | ICD-10-CM

## 2019-12-10 DIAGNOSIS — L814 Other melanin hyperpigmentation: Secondary | ICD-10-CM

## 2019-12-10 NOTE — Progress Notes (Signed)
Follow-Up Visit   Subjective  Christopher [name] is a 74 y.o. male who presents for the following: Annual Exam (6 months f/u TBSE, HX of BCC, Hx of SCC). The patient presents for Total-Body Skin Exam (TBSE) for skin cancer screening and mole check.  The following portions of the chart were reviewed this encounter and updated as appropriate:  Tobacco  Allergies  Meds  Problems  Med Hx  Surg Hx  Fam Hx     Review of Systems:  No other skin or systemic complaints except as noted in HPI or Assessment and Plan.  Objective  Well appearing patient in no apparent distress; mood and affect are within normal limits.  A full examination was performed including scalp, head, eyes, ears, nose, lips, neck, chest, axillae, abdomen, back, buttocks, bilateral upper extremities, bilateral lower extremities, hands, feet, fingers, toes, fingernails, and toenails. All findings within normal limits unless otherwise noted below.  Objective  R preauricular: 1.5 cm flesh colored mild thickening area     Objective  Left Lower Leg - Anterior: Erythematous, scaly patches involving the ankle and distal lower leg with associated lower leg edema.    Assessment & Plan    History of Squamous Cell Carcinoma of the Skin - No evidence of recurrence today - No lymphadenopathy - Recommend regular full body skin exams - Recommend daily broad spectrum sunscreen SPF 30+ to sun-exposed areas, reapply every 2 hours as needed.  - Call if any new or changing lesions are noted between office visits  History of Basal Cell Carcinoma of the Skin - No evidence of recurrence today - Recommend regular full body skin exams - Recommend daily broad spectrum sunscreen SPF 30+ to sun-exposed areas, reapply every 2 hours as needed.  - Call if any new or changing lesions are noted between office visits  Neoplasm of skin R preauricular  Skin / nail biopsy Type of biopsy: tangential   Informed consent: discussed and  consent obtained   Patient was prepped and draped in usual sterile fashion: area prepped with alochol. Anesthesia: the lesion was anesthetized in a standard fashion   Anesthetic:  1% lidocaine w/ epinephrine 1-100,000 buffered w/ 8.4% NaHCO3 Instrument used: flexible razor blade   Hemostasis achieved with: pressure, aluminum chloride and electrodesiccation   Outcome: patient tolerated procedure well   Post-procedure details: wound care instructions given   Post-procedure details comment:  Ointment and small bandage  Specimen 1 - Surgical pathology Differential Diagnosis: R/O BCC Check Margins: No 1.5 cm flesh colored mild thickening area  Venous stasis dermatitis of left lower extremity Left Lower Leg - Anterior  Stasis dermatitis with Purpura  Skin cancer screening   Lentigines - Scattered tan macules - Discussed due to sun exposure - Benign, observe - Call for any changes  Seborrheic Keratoses - Stuck-on, waxy, tan-brown papules and plaques  - Discussed benign etiology and prognosis. - Observe - Call for any changes  Melanocytic Nevi - Tan-brown and/or pink-flesh-colored symmetric macules and papules - Benign appearing on exam today - Observation - Call clinic for new or changing moles - Recommend daily use of broad spectrum spf 30+ sunscreen to sun-exposed areas.   Hemangiomas - Red papules - Discussed benign nature - Observe - Call for any changes  Actinic Damage - diffuse scaly erythematous macules with underlying dyspigmentation - Recommend daily broad spectrum sunscreen SPF 30+ to sun-exposed areas, reapply every 2 hours as needed.  - Call for new or changing lesions.  Skin cancer screening  performed today.  Return in about 1 year (around 12/09/2020) for TBSE.  IMarye Round, CMA, am acting as scribe for Sarina Ser, MD .  Documentation: I have reviewed the above documentation for accuracy and completeness, and I agree with the above.  Sarina Ser, MD

## 2019-12-10 NOTE — Patient Instructions (Signed)

## 2019-12-11 ENCOUNTER — Encounter: Payer: Self-pay | Admitting: Dermatology

## 2019-12-12 ENCOUNTER — Telehealth: Payer: Self-pay

## 2019-12-12 NOTE — Telephone Encounter (Signed)
-----   Message from Ralene Bathe, MD sent at 12/11/2019  7:08 PM EDT ----- Skin , right preauricular MELANOCYTIC NEVUS, INTRADERMAL TYPE  Benign mole

## 2019-12-12 NOTE — Telephone Encounter (Signed)
Patient informed of pathology results 

## 2019-12-18 ENCOUNTER — Ambulatory Visit: Payer: Medicare Other | Admitting: Pulmonary Disease

## 2020-01-28 IMAGING — CT NM PET TUM IMG INITIAL (PI) SKULL BASE T - THIGH
1 of 9 series · 1 of 25 positions shown · non-contrast
Comparison: Chest CT 02/06/2018.  Abdominopelvic CT 06/30/2014.

CLINICAL DATA: Initial treatment strategy for left upper lobe
pulmonary nodules..

EXAM:
NUCLEAR MEDICINE PET SKULL BASE TO THIGH
TECHNIQUE: 11.1 mCi F-18 FDG was injected intravenously. Full-ring PET imaging
was performed from the skull base to thigh after the radiotracer. CT
data was obtained and used for attenuation correction and anatomic
localization.
Fasting blood glucose: 127 mg/dl

[Series 3: ct wb 5.0 b30f · axial · 5.0mm · 0.98mm/px · 1 of 329 slices shown]
[im 329/329  brain]
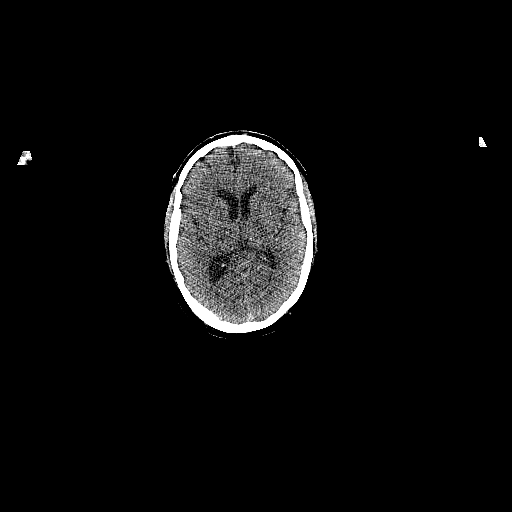

[1 of 25 positions shown; findings below may reference images not displayed]

FINDINGS: Mediastinal blood pool activity: SUV max

NECK: No areas of abnormal hypermetabolism.

Incidental CT findings: No cervical adenopathy.

CHEST: 2 left upper lobe pulmonary nodules are again identified. The
more cephalad and medial measures 1.3 cm and a S.U.V. max of 2.6 on
image 90/3. Immediately inferior and lateral to this, a 1.3 cm
nodule measures a S.U.V. max of 3.0 on image 93/3.

No thoracic nodal hypermetabolism.

Incidental CT findings: Deferred to recent diagnostic CT.
Multivessel coronary artery atherosclerosis. Bibasilar scarring.

ABDOMEN/PELVIS: Mild hypermetabolism corresponding to minimal right
adrenal nodularity. Example at 10 mm and a S.U.V. max of 2.9 on
image 154/3. This nodularity is low-density and similar on
06/30/2014, favoring an adenoma.

No other abdominopelvic hypermetabolism identified.

Incidental CT findings: Minimal left adrenal nodularity is also
similar to 4447. 4 mm interpolar left renal collecting system
calculus. Abdominal aortic atherosclerosis. Extensive colonic
diverticulosis. Fat containing right inguinal hernia.

SKELETON: No abnormal marrow activity.

Incidental CT findings: none
IMPRESSION: 1. Low-level hypermetabolism corresponding to 2 adjacent left upper
lobe pulmonary nodules. Although other findings in this area
(clustered calcifications and architectural distortion) suggest an
underlying infectious etiology, primary bronchogenic carcinoma or
carcinomas cannot be excluded.
2. No hypermetabolic thoracic lymph nodes.
3. Right adrenal hypermetabolism, corresponding to stable nodularity
relative to 4447. Favor an adenoma. Recommend attention on
follow-up.
4. Coronary artery atherosclerosis. Aortic Atherosclerosis
([id number]).
5. Left nephrolithiasis.

## 2020-12-15 ENCOUNTER — Other Ambulatory Visit: Payer: Self-pay

## 2020-12-15 ENCOUNTER — Ambulatory Visit (INDEPENDENT_AMBULATORY_CARE_PROVIDER_SITE_OTHER): Payer: Medicare Other | Admitting: Dermatology

## 2020-12-15 DIAGNOSIS — I872 Venous insufficiency (chronic) (peripheral): Secondary | ICD-10-CM

## 2020-12-15 DIAGNOSIS — D692 Other nonthrombocytopenic purpura: Secondary | ICD-10-CM

## 2020-12-15 DIAGNOSIS — L821 Other seborrheic keratosis: Secondary | ICD-10-CM

## 2020-12-15 DIAGNOSIS — Z85828 Personal history of other malignant neoplasm of skin: Secondary | ICD-10-CM

## 2020-12-15 DIAGNOSIS — D1800 Hemangioma unspecified site: Secondary | ICD-10-CM

## 2020-12-15 DIAGNOSIS — Z1283 Encounter for screening for malignant neoplasm of skin: Secondary | ICD-10-CM | POA: Diagnosis not present

## 2020-12-15 DIAGNOSIS — L814 Other melanin hyperpigmentation: Secondary | ICD-10-CM

## 2020-12-15 DIAGNOSIS — L820 Inflamed seborrheic keratosis: Secondary | ICD-10-CM | POA: Diagnosis not present

## 2020-12-15 DIAGNOSIS — L570 Actinic keratosis: Secondary | ICD-10-CM

## 2020-12-15 DIAGNOSIS — L578 Other skin changes due to chronic exposure to nonionizing radiation: Secondary | ICD-10-CM

## 2020-12-15 DIAGNOSIS — D229 Melanocytic nevi, unspecified: Secondary | ICD-10-CM

## 2020-12-15 NOTE — Patient Instructions (Signed)

## 2020-12-15 NOTE — Progress Notes (Signed)
Follow-Up Visit   Subjective  Christopher [name] is a 75 y.o. male who presents for the following: Annual Exam (Mole check ). Hx of BCC on the left postauricular.  The patient presents for Total-Body Skin Exam (TBSE) for skin cancer screening and mole check.   The following portions of the chart were reviewed this encounter and updated as appropriate:   Tobacco  Allergies  Meds  Problems  Med Hx  Surg Hx  Fam Hx     Review of Systems:  No other skin or systemic complaints except as noted in HPI or Assessment and Plan.  Objective  Well appearing patient in no apparent distress; mood and affect are within normal limits.  A full examination was performed including scalp, head, eyes, ears, nose, lips, neck, chest, axillae, abdomen, back, buttocks, bilateral upper extremities, bilateral lower extremities, hands, feet, fingers, toes, fingernails, and toenails. All findings within normal limits unless otherwise noted below.  back of neck behind ears Well healed scar with no evidence of recurrence.   face x 4 (4) Erythematous thin papules/macules with gritty scale.   Scalp x 2 (2) Erythematous keratotic or waxy stuck-on papule or plaque.   lower legs Erythematous, scaly patches involving the ankle and distal lower leg with associated lower leg edema.    Assessment & Plan  History of basal cell carcinoma (BCC) back of neck behind ears  Clear. Observe for recurrence. Call clinic for new or changing lesions.  Recommend regular skin exams, daily broad-spectrum spf 30+ sunscreen use, and photoprotection.     AK (actinic keratosis) (4) face x 4  Destruction of lesion - face x 4 Complexity: simple   Destruction method: cryotherapy   Informed consent: discussed and consent obtained   Timeout:  patient name, date of birth, surgical site, and procedure verified Lesion destroyed using liquid nitrogen: Yes   Region frozen until ice ball extended beyond lesion: Yes   Outcome: patient  tolerated procedure well with no complications   Post-procedure details: wound care instructions given    Inflamed seborrheic keratosis Scalp x 2  Destruction of lesion - Scalp x 2 Complexity: simple   Destruction method: cryotherapy   Informed consent: discussed and consent obtained   Timeout:  patient name, date of birth, surgical site, and procedure verified Lesion destroyed using liquid nitrogen: Yes   Region frozen until ice ball extended beyond lesion: Yes   Outcome: patient tolerated procedure well with no complications   Post-procedure details: wound care instructions given    Venous stasis dermatitis of right lower extremity lower legs  Stasis in the legs causes chronic leg swelling, which may result in itchy or painful rashes, skin discoloration, skin texture changes, and sometimes ulceration.  Recommend daily compression hose/stockings- easiest to put on first thing in morning, remove at bedtime.  Elevate legs as much as possible. Avoid salt/sodium rich foods.   Skin cancer screening  Lentigines - Scattered tan macules - Due to sun exposure - Benign-appering, observe - Recommend daily broad spectrum sunscreen SPF 30+ to sun-exposed areas, reapply every 2 hours as needed. - Call for any changes  Seborrheic Keratoses - Stuck-on, waxy, tan-brown papules and/or plaques  - Benign-appearing - Discussed benign etiology and prognosis. - Observe - Call for any changes  Melanocytic Nevi - Tan-brown and/or pink-flesh-colored symmetric macules and papules - Benign appearing on exam today - Observation - Call clinic for new or changing moles - Recommend daily use of broad spectrum spf 30+ sunscreen to sun-exposed  areas.   Hemangiomas - Red papules - Discussed benign nature - Observe - Call for any changes  Actinic Damage - Chronic condition, secondary to cumulative UV/sun exposure - diffuse scaly erythematous macules with underlying dyspigmentation - Recommend daily  broad spectrum sunscreen SPF 30+ to sun-exposed areas, reapply every 2 hours as needed.  - Staying in the shade or wearing long sleeves, sun glasses (UVA+UVB protection) and wide brim hats (4-inch brim around the entire circumference of the hat) are also recommended for sun protection.  - Call for new or changing lesions.  Purpura - Chronic; persistent and recurrent.  Treatable, but not curable. - Violaceous macules and patches - Benign - Related to trauma, age, sun damage and/or use of blood thinners, chronic use of topical and/or oral steroids - Observe - Can use OTC arnica containing moisturizer such as Dermend Bruise Formula if desired - Call for worsening or other concerns   History of Basal Cell Carcinoma of the Skin Left postauricular  - No evidence of recurrence today - Recommend regular full body skin exams - Recommend daily broad spectrum sunscreen SPF 30+ to sun-exposed areas, reapply every 2 hours as needed.  - Call if any new or changing lesions are noted between office visits  Skin cancer screening performed today.   Return in about 1 year (around 12/15/2021) for TBSE, Hx of BCC .  IMarye Round, CMA, am acting as scribe for Sarina Ser, MD .  Documentation: I have reviewed the above documentation for accuracy and completeness, and I agree with the above.  Sarina Ser, MD

## 2020-12-17 ENCOUNTER — Encounter: Payer: Self-pay | Admitting: Dermatology

## 2021-04-08 ENCOUNTER — Encounter: Payer: Self-pay | Admitting: Gastroenterology

## 2021-04-09 ENCOUNTER — Ambulatory Visit
Admission: RE | Admit: 2021-04-09 | Discharge: 2021-04-09 | Disposition: A | Discharge status: Home / Self Care | Payer: Medicare Other | Attending: Gastroenterology | Admitting: Gastroenterology

## 2021-04-09 ENCOUNTER — Other Ambulatory Visit: Payer: Self-pay

## 2021-04-09 ENCOUNTER — Encounter: Payer: Self-pay | Admitting: Gastroenterology

## 2021-04-09 ENCOUNTER — Ambulatory Visit: Payer: Medicare Other | Admitting: Anesthesiology

## 2021-04-09 ENCOUNTER — Encounter
Admission: RE | Disposition: A | Discharge status: Home / Self Care | Payer: Self-pay | Source: Home / Self Care | Attending: Gastroenterology

## 2021-04-09 DIAGNOSIS — Z13810 Encounter for screening for upper gastrointestinal disorder: Secondary | ICD-10-CM | POA: Diagnosis present

## 2021-04-09 DIAGNOSIS — I10 Essential (primary) hypertension: Secondary | ICD-10-CM | POA: Insufficient documentation

## 2021-04-09 DIAGNOSIS — Z7901 Long term (current) use of anticoagulants: Secondary | ICD-10-CM | POA: Insufficient documentation

## 2021-04-09 DIAGNOSIS — K449 Diaphragmatic hernia without obstruction or gangrene: Secondary | ICD-10-CM | POA: Insufficient documentation

## 2021-04-09 DIAGNOSIS — Z7984 Long term (current) use of oral hypoglycemic drugs: Secondary | ICD-10-CM | POA: Insufficient documentation

## 2021-04-09 DIAGNOSIS — E119 Type 2 diabetes mellitus without complications: Secondary | ICD-10-CM | POA: Insufficient documentation

## 2021-04-09 DIAGNOSIS — I4891 Unspecified atrial fibrillation: Secondary | ICD-10-CM | POA: Insufficient documentation

## 2021-04-09 DIAGNOSIS — Z87891 Personal history of nicotine dependence: Secondary | ICD-10-CM | POA: Insufficient documentation

## 2021-04-09 DIAGNOSIS — K2100 Gastro-esophageal reflux disease with esophagitis, without bleeding: Secondary | ICD-10-CM | POA: Insufficient documentation

## 2021-04-09 DIAGNOSIS — K317 Polyp of stomach and duodenum: Secondary | ICD-10-CM | POA: Insufficient documentation

## 2021-04-09 DIAGNOSIS — Z79899 Other long term (current) drug therapy: Secondary | ICD-10-CM | POA: Insufficient documentation

## 2021-04-09 DIAGNOSIS — E785 Hyperlipidemia, unspecified: Secondary | ICD-10-CM | POA: Insufficient documentation

## 2021-04-09 HISTORY — PX: ESOPHAGOGASTRODUODENOSCOPY: SHX5428

## 2021-04-09 LAB — GLUCOSE, CAPILLARY: Glucose-Capillary: 132 mg/dL — ABNORMAL HIGH (ref 70–99)

## 2021-04-09 SURGERY — EGD (ESOPHAGOGASTRODUODENOSCOPY)
Anesthesia: General

## 2021-04-09 MED ORDER — LIDOCAINE HCL (PF) 2 % IJ SOLN
INTRAMUSCULAR | Status: AC
  Filled 2021-04-09: qty 5

## 2021-04-09 MED ORDER — LIDOCAINE HCL (CARDIAC) PF 100 MG/5ML IV SOSY
PREFILLED_SYRINGE | INTRAVENOUS | Status: DC | PRN
  Administered 2021-04-09: 50 mg via INTRAVENOUS

## 2021-04-09 MED ORDER — PROPOFOL 500 MG/50ML IV EMUL
INTRAVENOUS | Status: AC
  Filled 2021-04-09: qty 50

## 2021-04-09 MED ORDER — PROPOFOL 10 MG/ML IV BOLUS
INTRAVENOUS | Status: DC | PRN
  Administered 2021-04-09: 60 mg via INTRAVENOUS

## 2021-04-09 MED ORDER — SODIUM CHLORIDE 0.9 % IV SOLN: INTRAVENOUS | Status: DC

## 2021-04-09 MED ORDER — PROPOFOL 500 MG/50ML IV EMUL
INTRAVENOUS | Status: DC | PRN
  Administered 2021-04-09: 175 ug/kg/min via INTRAVENOUS

## 2021-04-09 NOTE — Anesthesia Postprocedure Evaluation (Signed)
Anesthesia Post Note  Patient: Christopher [name]  Procedure(s) Performed: ESOPHAGOGASTRODUODENOSCOPY (EGD)  Patient location during evaluation: Endoscopy Anesthesia Type: General Level of consciousness: awake and alert Pain management: pain level controlled Vital Signs Assessment: post-procedure vital signs reviewed and stable Respiratory status: spontaneous breathing, nonlabored ventilation, respiratory function stable and patient connected to nasal cannula oxygen Cardiovascular status: blood pressure returned to baseline and stable Postop Assessment: no apparent nausea or vomiting Anesthetic complications: no   No notable events documented.   Last Vitals:  Vitals:   04/09/21 0930 04/09/21 0947  BP: 123/60 (!) 155/86  Pulse: 61   Resp: 18   Temp:    SpO2: 96%     Last Pain:  Vitals:   04/09/21 0947  TempSrc:   PainSc: 0-No pain                 Precious Haws Trichelle Lehan

## 2021-04-09 NOTE — Op Note (Addendum)
[facility] Patient Name: Christopher [name] Procedure Date: 04/09/2021 9:01 AM MRN: [id number] Account #: [id number] Date of Birth: [birth year] Admit Type: Outpatient Age: 75 Room: [facility] ENDO ROOM 1 Gender: Male Note Status: Finalized Instrument Name: Upper Endoscope [id number] Procedure:             Upper GI endoscopy Indications:           Screening for Barrett's esophagus Providers:             Rueben Bash, DO Referring MD:          Leonie Douglas. Doy Hutching, MD (Referring MD) Medicines:             Monitored Anesthesia Care Complications:         No immediate complications. Estimated blood loss:                         Minimal. Procedure:             Pre-Anesthesia Assessment:                        - Prior to the procedure, a History and Physical was                         performed, and patient medications and allergies were                         reviewed. The patient is competent. The risks and                         benefits of the procedure and the sedation options and                         risks were discussed with the patient. All questions                         were answered and informed consent was obtained.                         Patient identification and proposed procedure were                         verified by the physician, the nurse, the anesthetist                         and the technician in the endoscopy suite. Mental                         Status Examination: alert and oriented. Airway                         Examination: normal oropharyngeal airway and neck                         mobility. Respiratory Examination: clear to                         auscultation. CV Examination: RRR, no murmurs, no S3  or S4. Prophylactic Antibiotics: The patient does not                         require prophylactic antibiotics. Prior                         Anticoagulants: The patient has taken Eliquis                          (apixaban), last dose was 4 days prior to procedure.                         ASA Grade Assessment: III - A patient with severe                         systemic disease. After reviewing the risks and                         benefits, the patient was deemed in satisfactory                         condition to undergo the procedure. The anesthesia                         plan was to use monitored anesthesia care (MAC).                         Immediately prior to administration of medications,                         the patient was re-assessed for adequacy to receive                         sedatives. The heart rate, respiratory rate, oxygen                         saturations, blood pressure, adequacy of pulmonary                         ventilation, and response to care were monitored                         throughout the procedure. The physical status of the                         patient was re-assessed after the procedure.                        After obtaining informed consent, the endoscope was                         passed under direct vision. Throughout the procedure,                         the patient's blood pressure, pulse, and oxygen                         saturations were monitored continuously. The Endoscope  was introduced through the mouth, and advanced to the                         second part of duodenum. The upper GI endoscopy was                         accomplished without difficulty. The patient tolerated                         the procedure well. Findings:      The duodenal bulb, first portion of the duodenum and second portion of       the duodenum were normal. Estimated blood loss: none.      A few 1 to 5 mm sessile polyps with no bleeding and no stigmata of       recent bleeding were found in the gastric fundus and in the gastric       body. Biopsies were taken with a cold forceps for histology. Estimated       blood  loss was minimal.      A small hiatal hernia was present.      The exam of the stomach was otherwise normal.      The Z-line was variable and was found 42 cm from the incisors. Biopsies       were taken with a cold forceps for histology. Estimated blood loss was       minimal. One small island of salmon colored mucosa. C0M1; No nodularity       or lesion noted noted.      Esophagogastric landmarks were identified: the gastroesophageal junction       was found at 42 cm from the incisors.      The exam of the esophagus was otherwise normal. Impression:            - Normal duodenal bulb, first portion of the duodenum                         and second portion of the duodenum.                        - A few gastric polyps. Biopsied.                        - Small hiatal hernia.                        - Z-line variable, 42 cm from the incisors. Biopsied.                        - Esophagogastric landmarks identified. Recommendation:        - Discharge patient to home.                        - Resume previous diet.                        - Continue present medications.                        - Resume Eliquis (apixaban) at prior dose tomorrow.  Refer to referring physician for further adjustment of                         therapy.                        - Await pathology results.                        - Repeat upper endoscopy in 3 years for surveillance                         of Barrett's esophagus.                        - Return to referring physician as previously                         scheduled. Procedure Code(s):     --- Professional ---                        [phone], Esophagogastroduodenoscopy, flexible,                         transoral; with biopsy, single or multiple Diagnosis Code(s):     --- Professional ---                        K31.7, Polyp of stomach and duodenum                        K44.9, Diaphragmatic hernia without obstruction or                          gangrene                        K22.8, Other specified diseases of esophagus                        Z13.810, Encounter for screening for upper                         gastrointestinal disorder CPT copyright 2019 American [employer] Association. All rights reserved. The codes documented in this report are preliminary and upon coder review may  be revised to meet current compliance requirements. Attending Participation:      I personally performed the entire procedure. Volney American, DO Annamaria Helling DO, DO 04/09/2021 9:29:38 AM This report has been signed electronically. Number of Addenda: 0 Note Initiated On: 04/09/2021 9:01 AM Estimated Blood Loss:  Estimated blood loss was minimal.      [facility]

## 2021-04-09 NOTE — Anesthesia Preprocedure Evaluation (Signed)
Anesthesia Evaluation  Patient identified by MRN, date of birth, ID band Patient awake    Reviewed: Allergy & Precautions, NPO status , Patient's Chart, lab work & pertinent test results  History of Anesthesia Complications Negative for: history of anesthetic complications  Airway Mallampati: II  TM Distance: <3 FB Neck ROM: limited    Dental  (+) Chipped   Pulmonary sleep apnea and Continuous Positive Airway Pressure Ventilation , former smoker,    Pulmonary exam normal        Cardiovascular hypertension, Pt. on medications and Pt. on home beta blockers + CAD  Normal cardiovascular exam+ dysrhythmias (hx, not in afib currently) Atrial Fibrillation      Neuro/Psych  Neuromuscular disease (peripheral neuropathy)    GI/Hepatic GERD  Medicated and Controlled,  Endo/Other  diabetes, Type 2, Oral Hypoglycemic AgentsHypothyroidism   Renal/GU Renal InsufficiencyRenal disease     Musculoskeletal  (+) Arthritis ,   Abdominal   Peds  Hematology   Anesthesia Other Findings Past Medical History: No date: Acid reflux No date: Arthritis No date: Atrial fibrillation ([place]) 11/01/2012: Basal cell carcinoma     Comment:  L post auricular/excision No date: BPH (benign prostatic hyperplasia) No date: CAD (coronary artery disease) No date: Colon adenomas No date: Disc displacement, lumbar No date: Diverticulosis No date: DM (diabetes mellitus) ([place]) No date: Dysrhythmia No date: ED (erectile dysfunction) No date: Gross hematuria No date: History of kidney stones No date: HLD (hyperlipidemia) No date: HTN (hypertension) No date: Hydronephrosis No date: Hypothyroidism No date: Kidney stones No date: OSA (obstructive sleep apnea) No date: Over weight No date: Paresthesia of foot No date: Peripheral neuropathy No date: Peripheral neuropathy No date: Renal insufficiency No date: Skin cancer No date: Sleep apnea No date:  Thyroid disease   Reproductive/Obstetrics                             Anesthesia Physical  Anesthesia Plan  ASA: III  Anesthesia Plan: General   Post-op Pain Management:    Induction: Intravenous  PONV Risk Score and Plan: TIVA and Propofol infusion  Airway Management Planned: Nasal Cannula  Additional Equipment:   Intra-op Plan:   Post-operative Plan:   Informed Consent: I have reviewed the patients History and Physical, chart, labs and discussed the procedure including the risks, benefits and alternatives for the proposed anesthesia with the patient or authorized representative who has indicated his/her understanding and acceptance.     Dental Advisory Given  Plan Discussed with: Anesthesiologist, CRNA and Surgeon  Anesthesia Plan Comments: (Patient consented for risks of anesthesia including but not limited to:  - adverse reactions to medications - risk of airway placement if required - damage to eyes, teeth, lips or other oral mucosa - nerve damage due to positioning  - sore throat or hoarseness - Damage to heart, brain, nerves, lungs, other parts of body or loss of life  Patient voiced understanding.)        Anesthesia Quick Evaluation

## 2021-04-09 NOTE — Anesthesia Procedure Notes (Signed)
Date/Time: 04/09/2021 9:10 AM Performed by: Johnna Acosta, CRNA Pre-anesthesia Checklist: Patient identified, Emergency Drugs available, Suction available, Patient being monitored and Timeout performed Oxygen Delivery Method: Supernova nasal CPAP Preoxygenation: Pre-oxygenation with 100% oxygen Induction Type: IV induction

## 2021-04-09 NOTE — Interval H&P Note (Signed)
History and Physical Interval Note: Preprocedure H&P from 04/09/21  was reviewed and there was no interval change after seeing and examining the patient.  Written consent was obtained from the patient after discussion of risks, benefits, and alternatives. Patient has consented to proceed with Esophagogastroduodenoscopy with possible intervention   04/09/2021 9:03 AM  Christopher [name]  has presented today for surgery, with the diagnosis of Barrett's esophagus without dysplasia (K22.70) Gastroesophageal reflux disease, unspecified whether esophagitis present (K21.9).  The various methods of treatment have been discussed with the patient and family. After consideration of risks, benefits and other options for treatment, the patient has consented to  Procedure(s) with comments: ESOPHAGOGASTRODUODENOSCOPY (EGD) (N/A) - DM as a surgical intervention.  The patient's history has been reviewed, patient examined, no change in status, stable for surgery.  I have reviewed the patient's chart and labs.  Questions were answered to the patient's satisfaction.     Annamaria Helling

## 2021-04-09 NOTE — H&P (Signed)
[name] Pre-Procedure H&P   Patient ID: Christopher [name] is a 75 y.o.   Gastroenterology Provider: Annamaria Helling, DO  Referring Provider: Laurine Blazer PCP: Idelle Crouch, MD  Date: 04/09/2021  HPI Mr. Christopher [name] is a 75 y.o. male who presents today for Esophagogastroduodenoscopy for Barretts Suveillance.  Reflux sx well controlled on protonix 20-40 mg po daily. No n/v dysphagia odynophagia.  Pt has held his eliquis. Denies nsaids.  Regular bm w/o blood diarrhea or constipation. Appetite and weight have been stable  2019- EGD- Dr. Gustavo Lah- hyperplastic polyp noted; negative h pylori; also with fundic gland polyp. + Barretts - short segment ~1cm. Also had egd in 2012 and 2017 Hgb stable 15 mcv 97 cr 0.9   Past Medical History:  Diagnosis Date   Acid reflux    Arthritis    Atrial fibrillation ([facility])    Basal cell carcinoma 11/01/2012   L post auricular/excision   BPH (benign prostatic hyperplasia)    CAD (coronary artery disease)    Colon adenomas    Disc displacement, lumbar    Diverticulosis    DM (diabetes mellitus) ([place])    Dysrhythmia    ED (erectile dysfunction)    Gross hematuria    History of kidney stones    HLD (hyperlipidemia)    HTN (hypertension)    Hydronephrosis    Hypothyroidism    Kidney stones    OSA (obstructive sleep apnea)    Over weight    Paresthesia of foot    Peripheral neuropathy    Peripheral neuropathy    Renal insufficiency    Skin cancer    Sleep apnea    Thyroid disease     Past Surgical History:  Procedure Laterality Date   BASAL CELL SKIN CANCER RESECTION     back of neck behind the ears   CARDIAC CATHETERIZATION     no stents   CATARACT EXTRACTION     both eyes sperate times   COLONOSCOPY N/A 03/26/2016   Procedure: COLONOSCOPY;  Surgeon: [name], MD;  Location: [facility] ENDOSCOPY;  Service: Endoscopy;  Laterality: N/A;   COLONOSCOPY WITH PROPOFOL N/A 07/08/2016   Procedure:  COLONOSCOPY WITH PROPOFOL;  Surgeon: [name], MD;  Location: [facility] ENDOSCOPY;  Service: Endoscopy;  Laterality: N/A;   ESOPHAGOGASTRODUODENOSCOPY (EGD) WITH PROPOFOL N/A 03/26/2016   Procedure: ESOPHAGOGASTRODUODENOSCOPY (EGD) WITH PROPOFOL;  Surgeon: [name], MD;  Location: [facility] ENDOSCOPY;  Service: Endoscopy;  Laterality: N/A;  Diabetic   ESOPHAGOGASTRODUODENOSCOPY (EGD) WITH PROPOFOL N/A 09/06/2017   Procedure: ESOPHAGOGASTRODUODENOSCOPY (EGD) WITH PROPOFOL;  Surgeon: [name], MD;  Location: [facility] ENDOSCOPY;  Service: Endoscopy;  Laterality: N/A;   EYE SURGERY     FLEXIBLE BRONCHOSCOPY Left 03/28/2018   Procedure: BRONCHOSCOPY WITH NAVIGATION AND CELLVIZIO;  Surgeon: Tyler Pita, MD;  Location: [facility] ORS;  Service: Cardiopulmonary;  Laterality: Left;   HERNIA REPAIR     umbilical   NASAL SINUS SURGERY     TONSILLECTOMY      Family History No h/o GI disease or malignancy  Review of Systems  Constitutional:  Negative for activity change, appetite change, chills, fatigue, fever and unexpected weight change.  HENT:  Negative for trouble swallowing and voice change.   Respiratory:  Negative for shortness of breath.   Cardiovascular:  Negative for chest pain and palpitations.  Gastrointestinal:  Negative for abdominal distention, abdominal pain, anal bleeding, blood in stool, constipation, diarrhea, nausea and vomiting.  Musculoskeletal:  Negative for  arthralgias and myalgias.  Skin:  Negative for color change and pallor.  Neurological:  Negative for dizziness, syncope and weakness.  Psychiatric/Behavioral:  Negative for confusion. The patient is not nervous/anxious.   All other systems reviewed and are negative.   Medications No current facility-administered medications on file prior to encounter.   Current Outpatient Medications on File Prior to Encounter  Medication Sig Dispense Refill   amLODipine (NORVASC) 5 MG tablet Take 5 mg by mouth daily.       atorvastatin (LIPITOR) 40 MG tablet Take 40 mg by mouth daily.     carvedilol (COREG) 25 MG tablet Take 25 mg by mouth 2 (two) times daily with a meal.     doxazosin (CARDURA) 4 MG tablet Take 4 mg by mouth daily.      empagliflozin (JARDIANCE) 25 MG TABS tablet Take 25 mg by mouth daily.     finasteride (PROSCAR) 5 MG tablet Take 5 mg by mouth daily.      glipiZIDE (GLUCOTROL XL) 10 MG 24 hr tablet Take 10 mg by mouth 2 (two) times daily.      levothyroxine (SYNTHROID) 88 MCG tablet Take by mouth.     lisinopril (PRINIVIL,ZESTRIL) 20 MG tablet Take 20 mg by mouth 2 (two) times daily.      pantoprazole (PROTONIX) 40 MG tablet Take 40 mg by mouth daily.      pioglitazone (ACTOS) 30 MG tablet Take 30 mg by mouth daily.      tamsulosin (FLOMAX) 0.4 MG CAPS capsule Take 0.4 mg by mouth daily.      acetaminophen (TYLENOL) 325 MG tablet Take 650 mg by mouth at bedtime.     apixaban (ELIQUIS) 5 MG TABS tablet Take 5 mg by mouth 2 (two) times daily.      azithromycin (ZITHROMAX Z-PAK) 250 MG tablet Take 2 tablets by mouth the first day followed by one tablet daily for next 4 days. (Patient not taking: Reported on 04/09/2021) 6 tablet 0   flecainide (TAMBOCOR) 50 MG tablet Take 50 mg by mouth 2 (two) times daily.      fluticasone (FLONASE) 50 MCG/ACT nasal spray Place 2 sprays into both nostrils daily as needed for allergies.      glucose blood (ONE TOUCH ULTRA TEST) test strip TEST twice a day or as directed      Pertinent medications related to GI and procedure were reviewed by me with the patient prior to the procedure   Current Facility-Administered Medications:    0.9 %  sodium chloride infusion, , Intravenous, Continuous, Annamaria Helling, DO, Last Rate: 20 mL/hr at 04/09/21 6010, Continued from Pre-op at 04/09/21 0852  sodium chloride 20 mL/hr at 04/09/21 9323       Allergies  Allergen Reactions   Etodolac     Other reaction(s): Other (See Comments) GI BLEED   Penicillins Rash     Has patient had a PCN reaction causing immediate rash, facial/tongue/throat swelling, SOB or lightheadedness with hypotension: No Has patient had a PCN reaction causing severe rash involving mucus membranes or skin necrosis: No Has patient had a PCN reaction that required hospitalization: No Has patient had a PCN reaction occurring within the last 10 years: No If all of the above answers are "NO", then may proceed with Cephalosporin use.    Allergies were reviewed by me prior to the procedure  Objective    Vitals:   04/09/21 0811  BP: (!) 163/84  Pulse: 65  Resp: 16  Temp: Marland Kitchen)  97.2 F (36.2 C)  TempSrc: Temporal  SpO2: 95%  Weight: 98 kg  Height: 5' 9.5" (1.765 m)     Physical Exam Vitals and nursing note reviewed.  Constitutional:      General: He is not in acute distress.    Appearance: Normal appearance. He is not ill-appearing, toxic-appearing or diaphoretic.  HENT:     Head: Normocephalic and atraumatic.     Nose: Nose normal.     Mouth/Throat:     Mouth: Mucous membranes are moist.     Pharynx: Oropharynx is clear.  Eyes:     General: No scleral icterus.    Extraocular Movements: Extraocular movements intact.  Cardiovascular:     Rate and Rhythm: Normal rate and regular rhythm.     Heart sounds: Normal heart sounds. No murmur heard.   No friction rub. No gallop.  Pulmonary:     Effort: Pulmonary effort is normal. No respiratory distress.     Breath sounds: Normal breath sounds. No wheezing, rhonchi or rales.  Abdominal:     General: Bowel sounds are normal. There is no distension.     Palpations: Abdomen is soft.     Tenderness: There is no abdominal tenderness. There is no guarding or rebound.  Musculoskeletal:     Cervical back: Neck supple.     Right lower leg: No edema.     Left lower leg: No edema.  Skin:    General: Skin is warm and dry.     Coloration: Skin is not jaundiced or pale.  Neurological:     General: No focal deficit present.      Mental Status: He is alert and oriented to person, place, and time. Mental status is at baseline.  Psychiatric:        Mood and Affect: Mood normal.        Behavior: Behavior normal.        Thought Content: Thought content normal.        Judgment: Judgment normal.     Assessment:  Christopher [name] is a 75 y.o. male  who presents today for Esophagogastroduodenoscopy for Barretts Suveillance.  Plan:  Esophagogastroduodenoscopy with possible intervention today  Esophagogastroduodenoscopy with possible biopsy, control of bleeding, polypectomy, and interventions as necessary has been discussed with the patient/patient representative. Informed consent was obtained from the patient/patient representative after explaining the indication, nature, and risks of the procedure including but not limited to death, bleeding, perforation, missed neoplasm/lesions, cardiorespiratory compromise, and reaction to medications. Opportunity for questions was given and appropriate answers were provided. Patient/patient representative has verbalized understanding is amenable to undergoing the procedure.   Annamaria Helling, DO  [facility]astroenter[facility]  Portions of the record may have been created with voice recognition software. Occasional wrong-word or 'sound-a-like' substitutions may have occurred due to the inherent limitations of voice recognition software.  Read the chart carefully and recognize, using context, where substitutions may have occurred.

## 2021-04-09 NOTE — Transfer of Care (Signed)
Immediate Anesthesia Transfer of Care Note  Patient: Eulas Post  Procedure(s) Performed: ESOPHAGOGASTRODUODENOSCOPY (EGD)  Patient Location: PACU  Anesthesia Type:General  Level of Consciousness: awake and drowsy  Airway & Oxygen Therapy: Patient Spontanous Breathing  Post-op Assessment: Report given to RN and Post -op Vital signs reviewed and stable  Post vital signs: Reviewed and stable  Last Vitals:  Vitals Value Taken Time  BP 123/60 04/09/21 0930  Temp 36.1 C 04/09/21 0927  Pulse 61 04/09/21 0930  Resp 27 04/09/21 0930  SpO2 95 % 04/09/21 0930  Vitals shown include unvalidated device data.  Last Pain:  Vitals:   04/09/21 0927  TempSrc: Temporal  PainSc: Asleep         Complications: No notable events documented.

## 2021-04-10 ENCOUNTER — Encounter: Payer: Self-pay | Admitting: Gastroenterology

## 2021-04-10 LAB — SURGICAL PATHOLOGY

## 2021-12-16 ENCOUNTER — Ambulatory Visit: Payer: Medicare Other | Admitting: Dermatology

## 2021-12-16 DIAGNOSIS — L578 Other skin changes due to chronic exposure to nonionizing radiation: Secondary | ICD-10-CM

## 2021-12-16 DIAGNOSIS — Z1283 Encounter for screening for malignant neoplasm of skin: Secondary | ICD-10-CM | POA: Diagnosis not present

## 2021-12-16 DIAGNOSIS — L570 Actinic keratosis: Secondary | ICD-10-CM

## 2021-12-16 DIAGNOSIS — L820 Inflamed seborrheic keratosis: Secondary | ICD-10-CM

## 2021-12-16 DIAGNOSIS — D229 Melanocytic nevi, unspecified: Secondary | ICD-10-CM

## 2021-12-16 DIAGNOSIS — L821 Other seborrheic keratosis: Secondary | ICD-10-CM

## 2021-12-16 DIAGNOSIS — L814 Other melanin hyperpigmentation: Secondary | ICD-10-CM

## 2021-12-16 DIAGNOSIS — Z85828 Personal history of other malignant neoplasm of skin: Secondary | ICD-10-CM

## 2021-12-16 DIAGNOSIS — D1800 Hemangioma unspecified site: Secondary | ICD-10-CM

## 2021-12-16 NOTE — Patient Instructions (Addendum)
Cryotherapy Aftercare  Wash gently with soap and water everyday.   Apply Vaseline and Band-Aid daily until healed.     Due to recent changes in healthcare laws, you may see results of your pathology and/or laboratory studies on MyChart before the doctors have had a chance to review them. We understand that in some cases there may be results that are confusing or concerning to you. Please understand that not all results are received at the same time and often the doctors may need to interpret multiple results in order to provide you with the best plan of care or course of treatment. Therefore, we ask that you please give us 2 business days to thoroughly review all your results before contacting the office for clarification. Should we see a critical lab result, you will be contacted sooner.   If You Need Anything After Your Visit  If you have any questions or concerns for your doctor, please call our main line at [phone] and press option 4 to reach your doctor's medical assistant. If no one answers, please leave a voicemail as directed and we will return your call as soon as possible. Messages left after 4 pm will be answered the following business day.   You may also send us a message via MyChart. We typically respond to MyChart messages within 1-2 business days.  For prescription refills, please ask your pharmacy to contact our office. Our fax number is [phone].  If you have an urgent issue when the clinic is closed that cannot wait until the next business day, you can page your doctor at the number below.    Please note that while we do our best to be available for urgent issues outside of office hours, we are not available 24/7.   If you have an urgent issue and are unable to reach us, you may choose to seek medical care at your doctor's office, retail clinic, urgent care center, or emergency room.  If you have a medical emergency, please immediately call 911 or go to the  emergency department.  Pager Numbers  - Dr. Kowalski: [phone]  - Dr. Moye: [phone]  - Dr. Stewart: [phone]  In the event of inclement weather, please call our main line at [phone] for an update on the status of any delays or closures.  Dermatology Medication Tips: Please keep the boxes that topical medications come in in order to help keep track of the instructions about where and how to use these. Pharmacies typically print the medication instructions only on the boxes and not directly on the medication tubes.   If your medication is too expensive, please contact our office at [phone] option 4 or send us a message through MyChart.   We are unable to tell what your co-pay for medications will be in advance as this is different depending on your insurance coverage. However, we may be able to find a substitute medication at lower cost or fill out paperwork to get insurance to cover a needed medication.   If a prior authorization is required to get your medication covered by your insurance company, please allow us 1-2 business days to complete this process.  Drug prices often vary depending on where the prescription is filled and some pharmacies may offer cheaper prices.  The website www.goodrx.com contains coupons for medications through different pharmacies. The prices here do not account for what the cost may be with help from insurance (it may be cheaper with your insurance), but the website can   give you the price if you did not use any insurance.  - You can print the associated coupon and take it with your prescription to the pharmacy.  - You may also stop by our office during regular business hours and pick up a GoodRx coupon card.  - If you need your prescription sent electronically to a different pharmacy, notify our office through [facility] MyChart or by phone at [phone] option 4.     Si Usted Necesita Algo Despus de Su Visita  Tambin puede  enviarnos un mensaje a travs de MyChart. Por lo general respondemos a los mensajes de MyChart en el transcurso de 1 a 2 das hbiles.  Para renovar recetas, por favor pida a su farmacia que se ponga en contacto con nuestra oficina. Nuestro nmero de fax es el [phone].  Si tiene un asunto urgente cuando la clnica est cerrada y que no puede esperar hasta el siguiente da hbil, puede llamar/localizar a su doctor(a) al nmero que aparece a continuacin.   Por favor, tenga en cuenta que aunque hacemos todo lo posible para estar disponibles para asuntos urgentes fuera del horario de oficina, no estamos disponibles las 24 horas del da, los 7 das de la semana.   Si tiene un problema urgente y no puede comunicarse con nosotros, puede optar por buscar atencin mdica  en el consultorio de su doctor(a), en una clnica privada, en un centro de atencin urgente o en una sala de emergencias.  Si tiene una emergencia mdica, por favor llame inmediatamente al 911 o vaya a la sala de emergencias.  Nmeros de bper  - Dr. Kowalski: [phone]  - Dra. Moye: [phone]  - Dra. Stewart: [phone]  En caso de inclemencias del tiempo, por favor llame a nuestra lnea principal al [phone] para una actualizacin sobre el estado de cualquier retraso o cierre.  Consejos para la medicacin en dermatologa: Por favor, guarde las cajas en las que vienen los medicamentos de uso tpico para ayudarle a seguir las instrucciones sobre dnde y cmo usarlos. Las farmacias generalmente imprimen las instrucciones del medicamento slo en las cajas y no directamente en los tubos del medicamento.   Si su medicamento es muy caro, por favor, pngase en contacto con nuestra oficina llamando al [phone] y presione la opcin 4 o envenos un mensaje a travs de MyChart.   No podemos decirle cul ser su copago por los medicamentos por adelantado ya que esto es diferente dependiendo de la cobertura de su seguro.  Sin embargo, es posible que podamos encontrar un medicamento sustituto a menor costo o llenar un formulario para que el seguro cubra el medicamento que se considera necesario.   Si se requiere una autorizacin previa para que su compaa de seguros cubra su medicamento, por favor permtanos de 1 a 2 das hbiles para completar este proceso.  Los precios de los medicamentos varan con frecuencia dependiendo del lugar de dnde se surte la receta y alguna farmacias pueden ofrecer precios ms baratos.  El sitio web www.goodrx.com tiene cupones para medicamentos de diferentes farmacias. Los precios aqu no tienen en cuenta lo que podra costar con la ayuda del seguro (puede ser ms barato con su seguro), pero el sitio web puede darle el precio si no utiliz ningn seguro.  - Puede imprimir el cupn correspondiente y llevarlo con su receta a la farmacia.  - Tambin puede pasar por nuestra oficina durante el horario de atencin regular y recoger una tarjeta de cupones de GoodRx.  -   Si necesita que su receta se enve electrnicamente a una farmacia diferente, informe a nuestra oficina a travs de MyChart de [facility] o por telfono llamando al [phone] y presione la opcin 4.  

## 2021-12-16 NOTE — Progress Notes (Signed)
Follow-Up Visit   Subjective  Christopher [name] is a 76 y.o. male who presents for the following: Annual Exam (Mole check ). The patient presents for Total-Body Skin Exam (TBSE) for skin cancer screening and mole check.  The patient has spots, moles and lesions to be evaluated, some may be new or changing and the patient has concerns that these could be cancer.   The following portions of the chart were reviewed this encounter and updated as appropriate:   Tobacco  Allergies  Meds  Problems  Med Hx  Surg Hx  Fam Hx     Review of Systems:  No other skin or systemic complaints except as noted in HPI or Assessment and Plan.  Objective  Well appearing patient in no apparent distress; mood and affect are within normal limits.  A full examination was performed including scalp, head, eyes, ears, nose, lips, neck, chest, axillae, abdomen, back, buttocks, bilateral upper extremities, bilateral lower extremities, hands, feet, fingers, toes, fingernails, and toenails. All findings within normal limits unless otherwise noted below.  left forehead, right ear, left ear (3) Erythematous thin papules/macules with gritty scale.   right forearm, scalp (2) Stuck-on, waxy, tan-brown papule or plaque --Discussed benign etiology and prognosis.    Assessment & Plan  AK (actinic keratosis) (3) left forehead, right ear, left ear  Actinic keratoses are precancerous spots that appear secondary to cumulative UV radiation exposure/sun exposure over time. They are chronic with expected duration over 1 year. A portion of actinic keratoses will progress to squamous cell carcinoma of the skin. It is not possible to reliably predict which spots will progress to skin cancer and so treatment is recommended to prevent development of skin cancer.  Recommend daily broad spectrum sunscreen SPF 30+ to sun-exposed areas, reapply every 2 hours as needed.  Recommend staying in the shade or wearing long sleeves, sun  glasses (UVA+UVB protection) and wide brim hats (4-inch brim around the entire circumference of the hat). Call for new or changing lesions.   Destruction of lesion - left forehead, right ear, left ear  Destruction method: cryotherapy   Informed consent: discussed and consent obtained   Lesion destroyed using liquid nitrogen: Yes   Region frozen until ice ball extended beyond lesion: Yes   Outcome: patient tolerated procedure well with no complications   Post-procedure details: wound care instructions given    Inflamed seborrheic keratosis (2) right forearm, scalp  Symptomatic, irritating, patient would like treated.   Destruction of lesion - right forearm, scalp Complexity: simple   Destruction method: cryotherapy   Informed consent: discussed and consent obtained   Timeout:  patient name, date of birth, surgical site, and procedure verified Lesion destroyed using liquid nitrogen: Yes   Region frozen until ice ball extended beyond lesion: Yes   Outcome: patient tolerated procedure well with no complications   Post-procedure details: wound care instructions given    Lentigines - Scattered tan macules - Due to sun exposure - Benign-appearing, observe - Recommend daily broad spectrum sunscreen SPF 30+ to sun-exposed areas, reapply every 2 hours as needed. - Call for any changes  Seborrheic Keratoses - Stuck-on, waxy, tan-brown papules and/or plaques  - Benign-appearing - Discussed benign etiology and prognosis. - Observe - Call for any changes  Melanocytic Nevi - Tan-brown and/or pink-flesh-colored symmetric macules and papules - Benign appearing on exam today - Observation - Call clinic for new or changing moles - Recommend daily use of broad spectrum spf 30+ sunscreen to sun-exposed  areas.   Hemangiomas - Red papules - Discussed benign nature - Observe - Call for any changes  Actinic Damage - Chronic condition, secondary to cumulative UV/sun exposure - diffuse  scaly erythematous macules with underlying dyspigmentation - Recommend daily broad spectrum sunscreen SPF 30+ to sun-exposed areas, reapply every 2 hours as needed.  - Staying in the shade or wearing long sleeves, sun glasses (UVA+UVB protection) and wide brim hats (4-inch brim around the entire circumference of the hat) are also recommended for sun protection.  - Call for new or changing lesions.  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  - Call if any new or changing lesions are noted between office visits  Skin cancer screening performed today.   Return in about 1 year (around 12/17/2022), or TBSE, hx of BCC, hx of SCC.  IMarye Round, CMA, am acting as scribe for Sarina Ser, MD . Documentation: I have reviewed the above documentation for accuracy and completeness, and I agree with the above.  Sarina Ser, MD

## 2021-12-26 ENCOUNTER — Encounter: Payer: Self-pay | Admitting: Dermatology

## 2022-04-11 NOTE — H&P (Signed)
Pre-Procedure H&P   Patient ID: Christopher [name] is a 76 y.o. male.  Gastroenterology Provider: Jaynie Collins, DO  Referring Provider: Dr. Judithann Sheen PCP: Marguarite Arbour, MD  Date: 04/12/2022  HPI Mr. Christopher [name] is a 76 y.o. male who presents today for Colonoscopy for Surveillance-personal history of colon polyps . Patient on Eliquis was been held for this procedure- last dose Friday pm  Patient with regular bowel movements without melena hematochezia diarrhea or constipation.  He has a history of poor to fair preparation for his colonoscopies.  He last underwent colonoscopy in 2018 with fair prep 1 tubular adenoma noted in the hepatic flexure, pandiverticulosis and internal hemorrhoids.  Has a history of CAD carotid stenosis OSA requiring CPAP DM2 hypertension and A-fib requiring anticoagulation.  Most recent lab work hemoglobin 15.1 MCV 97 platelets 1-65,000 creatinine 1.1 A1c 7.3  Past Medical History:  Diagnosis Date   Acid reflux    Arthritis    Atrial fibrillation (HCC)    Basal cell carcinoma 11/01/2012   L post auricular/excision   BPH (benign prostatic hyperplasia)    CAD (coronary artery disease)    Colon adenomas    Disc displacement, lumbar    Diverticulosis    DM (diabetes mellitus) (HCC)    Dysrhythmia    ED (erectile dysfunction)    Gross hematuria    History of kidney stones    HLD (hyperlipidemia)    HTN (hypertension)    Hydronephrosis    Hypothyroidism    Kidney stones    OSA (obstructive sleep apnea)    Over weight    Paresthesia of foot    Peripheral neuropathy    Peripheral neuropathy    Renal insufficiency    Skin cancer    Sleep apnea    Thyroid disease     Past Surgical History:  Procedure Laterality Date   BASAL CELL SKIN CANCER RESECTION     back of neck behind the ears   CARDIAC CATHETERIZATION     no stents   CATARACT EXTRACTION     both eyes sperate times   COLONOSCOPY N/A 03/26/2016   Procedure: COLONOSCOPY;   Surgeon: Christena Deem, MD;  Location[facility] ENDOSCOPY;  Service: Endoscopy;  Laterality: N/A;   COLONOSCOPY WITH PROPOFOL N/A 07/08/2016   Procedure: COLONOSCOPY WITH PROPOFOL;  Surgeon: Christena Deem, MD;  Location[facility] ENDOSCOPY;  Service: Endoscopy;  Laterality: N/A;   ESOPHAGOGASTRODUODENOSCOPY N/A 04/09/2021   Procedure: ESOPHAGOGASTRODUODENOSCOPY (EGD);  Surgeon: Jaynie Collins, DO;  Location: [facility] ENDOSCOPY;  Service: Gastroenterology;  Laterality: N/A;  DM   ESOPHAGOGASTRODUODENOSCOPY (EGD) WITH PROPOFOL N/A 03/26/2016   Procedure: ESOPHAGOGASTRODUODENOSCOPY (EGD) WITH PROPOFOL;  Surgeon: Christena Deem, MD;  Location: [facility] ENDOSCOPY;  Service: Endoscopy;  Laterality: N/A;  Diabetic   ESOPHAGOGASTRODUODENOSCOPY (EGD) WITH PROPOFOL N/A 09/06/2017   Procedure: ESOPHAGOGASTRODUODENOSCOPY (EGD) WITH PROPOFOL;  Surgeon: Christena Deem, MD;  Location: [facility] ENDOSCOPY;  Service: Endoscopy;  Laterality: N/A;   EYE SURGERY     FLEXIBLE BRONCHOSCOPY Left 03/28/2018   Procedure: BRONCHOSCOPY WITH NAVIGATION AND CELLVIZIO;  Surgeon: Salena Saner, MD;  Location: [facility] ORS;  Service: Cardiopulmonary;  Laterality: Left;   HERNIA REPAIR     umbilical   NASAL SINUS SURGERY     TONSILLECTOMY      Family History No h/o GI disease or malignancy  Review of Systems  Constitutional:  Negative for activity change, appetite change, chills, diaphoresis, fatigue, fever and unexpected weight change.  HENT:  Negative for trouble swallowing and voice change.   Respiratory:  Negative for shortness of breath and wheezing.   Cardiovascular:  Negative for chest pain, palpitations and leg swelling.  Gastrointestinal:  Negative for abdominal distention, abdominal pain, anal bleeding, blood in stool, constipation, diarrhea, nausea and vomiting.  Musculoskeletal:  Negative for arthralgias and myalgias.  Skin:  Negative for color change and pallor.  Neurological:  Negative for dizziness, syncope  and weakness.  Psychiatric/Behavioral:  Negative for confusion. The patient is not nervous/anxious.   All other systems reviewed and are negative.    Medications No current facility-administered medications on file prior to encounter.   Current Outpatient Medications on File Prior to Encounter  Medication Sig Dispense Refill   acetaminophen (TYLENOL) 325 MG tablet Take 650 mg by mouth at bedtime.     amLODipine (NORVASC) 5 MG tablet Take 5 mg by mouth daily.      apixaban (ELIQUIS) 5 MG TABS tablet Take 5 mg by mouth 2 (two) times daily.      atorvastatin (LIPITOR) 40 MG tablet Take 40 mg by mouth daily.     carvedilol (COREG) 25 MG tablet Take 25 mg by mouth 2 (two) times daily with a meal.     doxazosin (CARDURA) 4 MG tablet Take 4 mg by mouth daily.      empagliflozin (JARDIANCE) 25 MG TABS tablet Take 25 mg by mouth daily.     finasteride (PROSCAR) 5 MG tablet Take 5 mg by mouth daily.      fluticasone (FLONASE) 50 MCG/ACT nasal spray Place 2 sprays into both nostrils daily as needed for allergies.      glipiZIDE (GLUCOTROL XL) 10 MG 24 hr tablet Take 10 mg by mouth 2 (two) times daily.      glucose blood (ONE TOUCH ULTRA TEST) test strip TEST twice a day or as directed     lisinopril (PRINIVIL,ZESTRIL) 20 MG tablet Take 20 mg by mouth 2 (two) times daily.      pantoprazole (PROTONIX) 40 MG tablet Take 40 mg by mouth daily.      pioglitazone (ACTOS) 30 MG tablet Take 30 mg by mouth daily.      tamsulosin (FLOMAX) 0.4 MG CAPS capsule Take 0.4 mg by mouth daily.      azithromycin (ZITHROMAX Z-PAK) 250 MG tablet Take 2 tablets by mouth the first day followed by one tablet daily for next 4 days. (Patient not taking: Reported on 04/09/2021) 6 tablet 0   flecainide (TAMBOCOR) 50 MG tablet Take 50 mg by mouth 2 (two) times daily.      levothyroxine (SYNTHROID) 88 MCG tablet Take by mouth.      Pertinent medications related to GI and procedure were reviewed by me with the patient prior to  the procedure   Current Facility-Administered Medications:    0.9 %  sodium chloride infusion, , Intravenous, Continuous, Jaynie Collins, DO, Last Rate: 20 mL/hr at 04/12/22 0957, Continued from Pre-op at 04/12/22 0957      Allergies  Allergen Reactions   Etodolac     Other reaction(s): Other (See Comments) GI BLEED   Penicillins Rash    Has patient had a PCN reaction causing immediate rash, facial/tongue/throat swelling, SOB or lightheadedness with hypotension: No Has patient had a PCN reaction causing severe rash involving mucus membranes or skin necrosis: No Has patient had a PCN reaction that required hospitalization: No Has patient had a PCN reaction occurring within the last 10 years: No If all  of the above answers are "NO", then may proceed with Cephalosporin use.    Allergies were reviewed by me prior to the procedure  Objective   Body mass index is 31.29 kg/m. Vitals:   04/12/22 0934  BP: 121/79  Pulse: (!) 57  Resp: 20  Temp: (!) 96.1 F (35.6 C)  TempSrc: Temporal  SpO2: 96%  Weight: 97.5 kg  Height: 5' 9.5" (1.765 m)     Physical Exam Vitals and nursing note reviewed.  Constitutional:      General: He is not in acute distress.    Appearance: Normal appearance. He is not ill-appearing, toxic-appearing or diaphoretic.  HENT:     Head: Normocephalic and atraumatic.     Nose: Nose normal.     Mouth/Throat:     Mouth: Mucous membranes are moist.     Pharynx: Oropharynx is clear.  Eyes:     General: No scleral icterus.    Extraocular Movements: Extraocular movements intact.  Cardiovascular:     Rate and Rhythm: Regular rhythm. Bradycardia present.     Heart sounds: Normal heart sounds. No murmur heard.    No friction rub. No gallop.  Pulmonary:     Effort: Pulmonary effort is normal. No respiratory distress.     Breath sounds: Normal breath sounds. No wheezing, rhonchi or rales.  Abdominal:     General: Bowel sounds are normal. There is no  distension.     Palpations: Abdomen is soft.     Tenderness: There is no abdominal tenderness. There is no guarding or rebound.  Musculoskeletal:     Cervical back: Neck supple.     Right lower leg: No edema.     Left lower leg: No edema.  Skin:    General: Skin is warm and dry.     Coloration: Skin is not jaundiced or pale.  Neurological:     General: No focal deficit present.     Mental Status: He is alert and oriented to person, place, and time. Mental status is at baseline.  Psychiatric:        Mood and Affect: Mood normal.        Behavior: Behavior normal.        Thought Content: Thought content normal.        Judgment: Judgment normal.      Assessment:  Mr. Christopher [name] is a 76 y.o. male  who presents today for Colonoscopy for Surveillance-personal history of colon polyps .  Plan:  Colonoscopy with possible intervention today  Colonoscopy with possible biopsy, control of bleeding, polypectomy, and interventions as necessary has been discussed with the patient/patient representative. Informed consent was obtained from the patient/patient representative after explaining the indication, nature, and risks of the procedure including but not limited to death, bleeding, perforation, missed neoplasm/lesions, cardiorespiratory compromise, and reaction to medications. Opportunity for questions was given and appropriate answers were provided. Patient/patient representative has verbalized understanding is amenable to undergoing the procedure.   Jaynie Collins, DO  [facility]  Portions of the record may have been created with voice recognition software. Occasional wrong-word or 'sound-a-like' substitutions may have occurred due to the inherent limitations of voice recognition software.  Read the chart carefully and recognize, using context, where substitutions may have occurred.

## 2022-04-12 ENCOUNTER — Encounter: Payer: Self-pay | Admitting: Gastroenterology

## 2022-04-12 ENCOUNTER — Ambulatory Visit: Payer: Medicare Other | Admitting: Certified Registered Nurse Anesthetist

## 2022-04-12 ENCOUNTER — Encounter
Admission: RE | Disposition: A | Discharge status: Home / Self Care | Payer: Self-pay | Source: Home / Self Care | Attending: Gastroenterology

## 2022-04-12 ENCOUNTER — Ambulatory Visit
Admission: RE | Admit: 2022-04-12 | Discharge: 2022-04-12 | Disposition: A | Discharge status: Home / Self Care | Payer: Medicare Other | Attending: Gastroenterology | Admitting: Gastroenterology

## 2022-04-12 DIAGNOSIS — Z86010 Personal history of colonic polyps: Secondary | ICD-10-CM | POA: Diagnosis not present

## 2022-04-12 DIAGNOSIS — D125 Benign neoplasm of sigmoid colon: Secondary | ICD-10-CM | POA: Diagnosis not present

## 2022-04-12 DIAGNOSIS — K640 First degree hemorrhoids: Secondary | ICD-10-CM | POA: Diagnosis not present

## 2022-04-12 DIAGNOSIS — D122 Benign neoplasm of ascending colon: Secondary | ICD-10-CM | POA: Insufficient documentation

## 2022-04-12 DIAGNOSIS — I4891 Unspecified atrial fibrillation: Secondary | ICD-10-CM | POA: Diagnosis not present

## 2022-04-12 DIAGNOSIS — I10 Essential (primary) hypertension: Secondary | ICD-10-CM | POA: Insufficient documentation

## 2022-04-12 DIAGNOSIS — G4733 Obstructive sleep apnea (adult) (pediatric): Secondary | ICD-10-CM | POA: Insufficient documentation

## 2022-04-12 DIAGNOSIS — Z7901 Long term (current) use of anticoagulants: Secondary | ICD-10-CM | POA: Diagnosis not present

## 2022-04-12 DIAGNOSIS — E119 Type 2 diabetes mellitus without complications: Secondary | ICD-10-CM | POA: Insufficient documentation

## 2022-04-12 DIAGNOSIS — I2510 Atherosclerotic heart disease of native coronary artery without angina pectoris: Secondary | ICD-10-CM | POA: Diagnosis not present

## 2022-04-12 DIAGNOSIS — K5730 Diverticulosis of large intestine without perforation or abscess without bleeding: Secondary | ICD-10-CM | POA: Insufficient documentation

## 2022-04-12 DIAGNOSIS — Z1211 Encounter for screening for malignant neoplasm of colon: Secondary | ICD-10-CM | POA: Diagnosis not present

## 2022-04-12 HISTORY — PX: COLONOSCOPY: SHX5424

## 2022-04-12 LAB — GLUCOSE, CAPILLARY: Glucose-Capillary: 166 mg/dL — ABNORMAL HIGH (ref 70–99)

## 2022-04-12 SURGERY — COLONOSCOPY
Anesthesia: General

## 2022-04-12 MED ORDER — EPHEDRINE SULFATE (PRESSORS) 50 MG/ML IJ SOLN
INTRAMUSCULAR | Status: DC | PRN
  Administered 2022-04-12 (×2): 5 mg via INTRAVENOUS

## 2022-04-12 MED ORDER — SODIUM CHLORIDE 0.9 % IV SOLN
INTRAVENOUS | Status: DC
  Administered 2022-04-12: 20 mL/h via INTRAVENOUS

## 2022-04-12 MED ORDER — PROPOFOL 10 MG/ML IV BOLUS
INTRAVENOUS | Status: DC | PRN
  Administered 2022-04-12: 80 mg via INTRAVENOUS
  Administered 2022-04-12: 20 mg via INTRAVENOUS

## 2022-04-12 MED ORDER — PROPOFOL 500 MG/50ML IV EMUL
INTRAVENOUS | Status: DC | PRN
  Administered 2022-04-12: 160 ug/kg/min via INTRAVENOUS

## 2022-04-12 NOTE — Interval H&P Note (Signed)
History and Physical Interval Note: Preprocedure H&P from 04/12/22  was reviewed and there was no interval change after seeing and examining the patient.  Written consent was obtained from the patient after discussion of risks, benefits, and alternatives. Patient has consented to proceed with Colonoscopy with possible intervention   04/12/2022 10:09 AM  Christopher [name]  has presented today for surgery, with the diagnosis of Adenomatous polyp of colon, unspecified part of colon (D12.6).  The various methods of treatment have been discussed with the patient and family. After consideration of risks, benefits and other options for treatment, the patient has consented to  Procedure(s): COLONOSCOPY (N/A) as a surgical intervention.  The patient's history has been reviewed, patient examined, no change in status, stable for surgery.  I have reviewed the patient's chart and labs.  Questions were answered to the patient's satisfaction.     Annamaria Helling

## 2022-04-12 NOTE — Anesthesia Preprocedure Evaluation (Signed)
Anesthesia Evaluation  Patient identified by MRN, date of birth, ID band Patient awake    Reviewed: Allergy & Precautions, H&P , NPO status , Patient's Chart, lab work & pertinent test results, reviewed documented beta blocker date and time   Airway Mallampati: II   Neck ROM: full    Dental  (+) Poor Dentition   Pulmonary sleep apnea , former smoker   Pulmonary exam normal        Cardiovascular Exercise Tolerance: Good hypertension, On Medications + CAD  Normal cardiovascular exam+ dysrhythmias  Rhythm:regular Rate:Normal     Neuro/Psych  Neuromuscular disease  negative psych ROS   GI/Hepatic Neg liver ROS,GERD  Medicated,,  Endo/Other  diabetesHypothyroidism    Renal/GU Renal disease  negative genitourinary   Musculoskeletal   Abdominal   Peds  Hematology negative hematology ROS (+)   Anesthesia Other Findings Past Medical History: No date: Acid reflux No date: Arthritis No date: Atrial fibrillation ([place]) 11/01/2012: Basal cell carcinoma     Comment:  L post auricular/excision No date: BPH (benign prostatic hyperplasia) No date: CAD (coronary artery disease) No date: Colon adenomas No date: Disc displacement, lumbar No date: Diverticulosis No date: DM (diabetes mellitus) ([place]) No date: Dysrhythmia No date: ED (erectile dysfunction) No date: Gross hematuria No date: History of kidney stones No date: HLD (hyperlipidemia) No date: HTN (hypertension) No date: Hydronephrosis No date: Hypothyroidism No date: Kidney stones No date: OSA (obstructive sleep apnea) No date: Over weight No date: Paresthesia of foot No date: Peripheral neuropathy No date: Peripheral neuropathy No date: Renal insufficiency No date: Skin cancer No date: Sleep apnea No date: Thyroid disease Past Surgical History: No date: BASAL CELL SKIN CANCER RESECTION     Comment:  back of neck behind the ears No date: CARDIAC  CATHETERIZATION     Comment:  no stents No date: CATARACT EXTRACTION     Comment:  both eyes sperate times 03/26/2016: COLONOSCOPY; N/A     Comment:  Procedure: COLONOSCOPY;  Surgeon: [name], MD;              Location: [facility] ENDOSCOPY;  Service: Endoscopy;                Laterality: N/A; 07/08/2016: COLONOSCOPY WITH PROPOFOL; N/A     Comment:  Procedure: COLONOSCOPY WITH PROPOFOL;  Surgeon: [name], MD;  Location: [facility] ENDOSCOPY;  Service:               Endoscopy;  Laterality: N/A; 04/09/2021: ESOPHAGOGASTRODUODENOSCOPY; N/A     Comment:  Procedure: ESOPHAGOGASTRODUODENOSCOPY (EGD);  Surgeon:               Annamaria Helling, DO;  Location: [facility] ENDOSCOPY;                Service: Gastroenterology;  Laterality: N/A;  DM 03/26/2016: ESOPHAGOGASTRODUODENOSCOPY (EGD) WITH PROPOFOL; N/A     Comment:  Procedure: ESOPHAGOGASTRODUODENOSCOPY (EGD) WITH               PROPOFOL;  Surgeon: [name], MD;  Location:               [facility] END[facility]COPY;  Service: Endoscopy;  Laterality: N/A;                Diabetic 09/06/2017: ESOPHAGOGASTRODUODENOSCOPY (EGD) WITH PROPOFOL; N/A     Comment:  Procedure: ESOPHAGOGASTRODUODENOSCOPY (EGD) WITH  PROPOFOL;  Surgeon: [name], MD;  Location:               [facility] ENDOSCOPY;  Service: Endoscopy;  Laterality: N/A; No date: EYE SURGERY 03/28/2018: FLEXIBLE BRONCHOSCOPY; Left     Comment:  Procedure: BRONCHOSCOPY WITH NAVIGATION AND CELLVIZIO;                Surgeon: Tyler Pita, MD;  Location: [facility] ORS;                Service: Cardiopulmonary;  Laterality: Left; No date: HERNIA REPAIR     Comment:  umbilical No date: NASAL SINUS SURGERY No date: TONSILLECTOMY BMI    Body Mass Index: 31.29 kg/m     Reproductive/Obstetrics negative OB ROS                             Anesthesia Physical Anesthesia Plan  ASA: 3  Anesthesia Plan: General   Post-op Pain Management:     Induction:   PONV Risk Score and Plan:   Airway Management Planned:   Additional Equipment:   Intra-op Plan:   Post-operative Plan:   Informed Consent: I have reviewed the patients History and Physical, chart, labs and discussed the procedure including the risks, benefits and alternatives for the proposed anesthesia with the patient or authorized representative who has indicated his/her understanding and acceptance.     Dental Advisory Given  Plan Discussed with: CRNA  Anesthesia Plan Comments:        Anesthesia Quick Evaluation

## 2022-04-12 NOTE — Transfer of Care (Signed)
Immediate Anesthesia Transfer of Care Note  Patient: Christopher [name]  Procedure(s) Performed: COLONOSCOPY  Patient Location: PACU  Anesthesia Type:General  Level of Consciousness: drowsy  Airway & Oxygen Therapy: Patient Spontanous Breathing  [name]-op Assessment: Report given to RN and [name] -op Vital signs reviewed and stable  [name] vital signs: Reviewed and stable  Last Vitals:  Vitals Value Taken Time  BP    Temp    Pulse    Resp 26 04/12/22 1048  SpO2    Vitals shown include unvalidated device data.  Last Pain:  Vitals:   04/12/22 0934  TempSrc: Temporal  PainSc: 0-No pain         Complications: No notable events documented.

## 2022-04-12 NOTE — Anesthesia Procedure Notes (Signed)
Date/Time: 04/12/2022 10:11 AM  Performed by: Demetrius Charity, CRNAPre-anesthesia Checklist: Patient identified, Emergency Drugs available, Suction available, Patient being monitored and Timeout performed Patient Re-evaluated:Patient Re-evaluated prior to induction Oxygen Delivery Method: Nasal cannula Induction Type: IV induction Placement Confirmation: CO2 detector and positive ETCO2

## 2022-04-12 NOTE — Op Note (Signed)
[facility] Gastroenterology Patient Name: Christopher [name] Procedure Date: 04/12/2022 9:58 AM MRN: [id number] Account #: [id number] Date of Birth: 06/05/1945 Admit Type: Outpatient Age: 76 Room: [facility] ENDO ROOM 2 Gender: Male Note Status: Finalized Instrument Name: Colonscope [id number] Procedure:             Colonoscopy Indications:           High risk colon cancer surveillance: Personal history                         of colonic polyps Providers:             Rueben Bash, DO Referring MD:          Annamaria Helling DO, DO (Referring MD), Leonie Douglas.                         Doy Hutching, MD (Referring MD) Medicines:             Monitored Anesthesia Care Complications:         No immediate complications. Estimated blood loss:                         Minimal. Procedure:             Pre-Anesthesia Assessment:                        - Prior to the procedure, a History and Physical was                         performed, and patient medications and allergies were                         reviewed. The patient is competent. The risks and                         benefits of the procedure and the sedation options and                         risks were discussed with the patient. All questions                         were answered and informed consent was obtained.                         Patient identification and proposed procedure were                         verified by the physician, the nurse, the anesthetist                         and the technician in the endoscopy suite. Mental                         Status Examination: alert and oriented. Airway                         Examination: normal oropharyngeal airway and neck  mobility. Respiratory Examination: clear to                         auscultation. CV Examination: RRR, no murmurs, no S3                         or S4. Prophylactic Antibiotics: The patient does not                          require prophylactic antibiotics. Prior                         Anticoagulants: The patient has taken Eliquis                         (apixaban), last dose was 2 days prior to procedure.                         ASA Grade Assessment: III - A patient with severe                         systemic disease. After reviewing the risks and                         benefits, the patient was deemed in satisfactory                         condition to undergo the procedure. The anesthesia                         plan was to use monitored anesthesia care (MAC).                         Immediately prior to administration of medications,                         the patient was re-assessed for adequacy to receive                         sedatives. The heart rate, respiratory rate, oxygen                         saturations, blood pressure, adequacy of pulmonary                         ventilation, and response to care were monitored                         throughout the procedure. The physical status of the                         patient was re-assessed after the procedure.                        After obtaining informed consent, the colonoscope was                         passed under direct vision. Throughout the procedure,  the patient's blood pressure, pulse, and oxygen                         saturations were monitored continuously. The                         Colonoscope was introduced through the anus and                         advanced to the the terminal ileum, with                         identification of the appendiceal orifice and IC                         valve. The colonoscopy was performed without                         difficulty. The patient tolerated the procedure well.                         The quality of the bowel preparation was evaluated                         using the BBPS [facility] Bowel Preparation Scale) with                         scores of: Right  Colon = 2 (minor amount of residual                         staining, small fragments of stool and/or opaque                         liquid, but mucosa seen well), Transverse Colon = 3                         (entire mucosa seen well with no residual staining,                         small fragments of stool or opaque liquid) and Left                         Colon = 2 (minor amount of residual staining, small                         fragments of stool and/or opaque liquid, but mucosa                         seen well). The total BBPS score equals 7. The quality                         of the bowel preparation was good. The terminal ileum,                         ileocecal valve, appendiceal orifice, and rectum were  photographed. Findings:      The perianal and digital rectal examinations were normal. Pertinent       negatives include normal sphincter tone.      The terminal ileum appeared normal. Estimated blood loss: none.      Two sessile polyps were found in the sigmoid colon and ascending colon.       The polyps were 3 to 5 mm in size. These polyps were removed with a cold       snare. Resection and retrieval were complete. Estimated blood loss was       minimal.      Multiple small-mouthed diverticula were found in the entire colon.       Estimated blood loss: none.      Non-bleeding internal hemorrhoids were found during retroflexion. The       hemorrhoids were Grade I (internal hemorrhoids that do not prolapse).       Estimated blood loss: none.      The exam was otherwise without abnormality on direct and retroflexion       views. Impression:            - The examined portion of the ileum was normal.                        - Two 3 to 5 mm polyps in the sigmoid colon and in the                         ascending colon, removed with a cold snare. Resected                         and retrieved.                        - Diverticulosis in the entire examined  colon.                        - Non-bleeding internal hemorrhoids.                        - The examination was otherwise normal on direct and                         retroflexion views. Recommendation:        - Patient has a contact number available for                         emergencies. The signs and symptoms of potential                         delayed complications were discussed with the patient.                         Return to normal activities tomorrow. Written                         discharge instructions were provided to the patient.                        - Discharge patient to home.                        -  Resume previous diet.                        - Continue present medications.                        - No aspirin, ibuprofen, naproxen, or other                         non-steroidal anti-inflammatory drugs for 5 days after                         polyp removal.                        - Resume Eliquis (apixaban) at prior dose in 2 days.                         Refer to managing physician for further adjustment of                         therapy.                        - Await pathology results.                        - Repeat colonoscopy for surveillance based on                         pathology results.                        - Return to referring physician as previously                         scheduled.                        - The findings and recommendations were discussed with                         the patient. Procedure Code(s):     --- Professional ---                        [phone], Colonoscopy, flexible; with removal of                         tumor(s), polyp(s), or other lesion(s) by snare                         technique Diagnosis Code(s):     --- Professional ---                        Z86.010, Personal history of colonic polyps                        K64.0, First degree hemorrhoids                        D12.5, Benign neoplasm of sigmoid colon  D12.2, Benign neoplasm of ascending colon                        K57.30, Diverticulosis of large intestine without                         perforation or abscess without bleeding CPT copyright 2022 [employer]. All rights reserved. The codes documented in this report are preliminary and upon coder review may  be revised to meet current compliance requirements. Attending Participation:      I personally performed the entire procedure. Volney American, DO Annamaria Helling DO, DO 04/12/2022 10:49:21 AM This report has been signed electronically. Number of Addenda: 0 Note Initiated On: 04/12/2022 9:58 AM Scope Withdrawal Time: 0 hours 16 minutes 22 seconds  Total Procedure Duration: 0 hours 24 minutes 16 seconds  Estimated Blood Loss:  Estimated blood loss was minimal.      [facility]

## 2022-04-13 ENCOUNTER — Encounter: Payer: Self-pay | Admitting: Gastroenterology

## 2022-04-13 LAB — SURGICAL PATHOLOGY

## 2022-04-13 NOTE — Anesthesia Postprocedure Evaluation (Signed)
Anesthesia [name] Note  Patient: Christopher [name]  Procedure(s) Performed: COLONOSCOPY  Patient location during evaluation: PACU Anesthesia Type: General Level of consciousness: awake and alert Pain management: pain level controlled Vital Signs Assessment: [name]-procedure vital signs reviewed and stable Respiratory status: spontaneous breathing, nonlabored ventilation, respiratory function stable and patient connected to nasal cannula oxygen Cardiovascular status: blood pressure returned to baseline and stable Postop Assessment: no apparent nausea or vomiting Anesthetic complications: no   No notable events documented.   Last Vitals:  Vitals:   04/12/22 1058 04/12/22 1108  BP: 116/65 133/75  Pulse: (!) 58 (!) 57  Resp: 17 11  Temp:    SpO2: 99% 98%    Last Pain:  Vitals:   04/12/22 1108  TempSrc:   PainSc: 0-No pain                 Molli Barrows

## 2022-12-15 ENCOUNTER — Ambulatory Visit: Payer: Medicare Other | Admitting: Dermatology

## 2022-12-27 ENCOUNTER — Ambulatory Visit: Payer: Medicare Other | Admitting: Dermatology

## 2022-12-27 ENCOUNTER — Encounter: Payer: Self-pay | Admitting: Dermatology

## 2022-12-27 VITALS — BP 131/70 | HR 61

## 2022-12-27 DIAGNOSIS — Z1283 Encounter for screening for malignant neoplasm of skin: Secondary | ICD-10-CM

## 2022-12-27 DIAGNOSIS — D1801 Hemangioma of skin and subcutaneous tissue: Secondary | ICD-10-CM

## 2022-12-27 DIAGNOSIS — L570 Actinic keratosis: Secondary | ICD-10-CM

## 2022-12-27 DIAGNOSIS — H02849 Edema of unspecified eye, unspecified eyelid: Secondary | ICD-10-CM

## 2022-12-27 DIAGNOSIS — L821 Other seborrheic keratosis: Secondary | ICD-10-CM

## 2022-12-27 DIAGNOSIS — Z8589 Personal history of malignant neoplasm of other organs and systems: Secondary | ICD-10-CM

## 2022-12-27 DIAGNOSIS — Z872 Personal history of diseases of the skin and subcutaneous tissue: Secondary | ICD-10-CM

## 2022-12-27 DIAGNOSIS — L578 Other skin changes due to chronic exposure to nonionizing radiation: Secondary | ICD-10-CM

## 2022-12-27 DIAGNOSIS — D229 Melanocytic nevi, unspecified: Secondary | ICD-10-CM

## 2022-12-27 DIAGNOSIS — L814 Other melanin hyperpigmentation: Secondary | ICD-10-CM

## 2022-12-27 DIAGNOSIS — W908XXA Exposure to other nonionizing radiation, initial encounter: Secondary | ICD-10-CM | POA: Diagnosis not present

## 2022-12-27 DIAGNOSIS — Z85828 Personal history of other malignant neoplasm of skin: Secondary | ICD-10-CM

## 2022-12-27 NOTE — Patient Instructions (Addendum)
Cryotherapy Aftercare  Wash gently with soap and water everyday.   Apply Vaseline Jelly daily until healed.    Recommend daily broad spectrum sunscreen SPF 30+ to sun-exposed areas, reapply every 2 hours as needed. Call for new or changing lesions.  Staying in the shade or wearing long sleeves, sun glasses (UVA+UVB protection) and wide brim hats (4-inch brim around the entire circumference of the hat) are also recommended for sun protection.    Melanoma ABCDEs  Melanoma is the most dangerous type of skin cancer, and is the leading cause of death from skin disease.  You are more likely to develop melanoma if you: Have light-colored skin, light-colored eyes, or red or blond hair Spend a lot of time in the sun Tan regularly, either outdoors or in a tanning bed Have had blistering sunburns, especially during childhood Have a close family member who has had a melanoma Have atypical moles or large birthmarks  Early detection of melanoma is key since treatment is typically straightforward and cure rates are extremely high if we catch it early.   The first sign of melanoma is often a change in a mole or a new dark spot.  The ABCDE system is a way of remembering the signs of melanoma.  A for asymmetry:  The two halves do not match. B for border:  The edges of the growth are irregular. C for color:  A mixture of colors are present instead of an even brown color. D for diameter:  Melanomas are usually (but not always) greater than 6mm - the size of a pencil eraser. E for evolution:  The spot keeps changing in size, shape, and color.  Please check your skin once per month between visits. You can use a small mirror in front and a large mirror behind you to keep an eye on the back side or your body.   If you see any new or changing lesions before your next follow-up, please call to schedule a visit.  Please continue daily skin protection including broad spectrum sunscreen SPF 30+ to sun-exposed  areas, reapplying every 2 hours as needed when you're outdoors.   Staying in the shade or wearing long sleeves, sun glasses (UVA+UVB protection) and wide brim hats (4-inch brim around the entire circumference of the hat) are also recommended for sun protection.    Due to recent changes in healthcare laws, you may see results of your pathology and/or laboratory studies on MyChart before the doctors have had a chance to review them. We understand that in some cases there may be results that are confusing or concerning to you. Please understand that not all results are received at the same time and often the doctors may need to interpret multiple results in order to provide you with the best plan of care or course of treatment. Therefore, we ask that you please give us 2 business days to thoroughly review all your results before contacting the office for clarification. Should we see a critical lab result, you will be contacted sooner.   If You Need Anything After Your Visit  If you have any questions or concerns for your doctor, please call our main line at [phone] and press option 4 to reach your doctor's medical assistant. If no one answers, please leave a voicemail as directed and we will return your call as soon as possible. Messages left after 4 pm will be answered the following business day.   You may also send us a message via MyChart.   We typically respond to MyChart messages within 1-2 business days.  For prescription refills, please ask your pharmacy to contact our office. Our fax number is [phone].  If you have an urgent issue when the clinic is closed that cannot wait until the next business day, you can page your doctor at the number below.    Please note that while we do our best to be available for urgent issues outside of office hours, we are not available 24/7.   If you have an urgent issue and are unable to reach us, you may choose to seek medical care at your doctor's  office, retail clinic, urgent care center, or emergency room.  If you have a medical emergency, please immediately call 911 or go to the emergency department.  Pager Numbers  - Dr. Kowalski: [phone]  - Dr. Moye: [phone]  - Dr. Stewart: [phone]  In the event of inclement weather, please call our main line at [phone] for an update on the status of any delays or closures.  Dermatology Medication Tips: Please keep the boxes that topical medications come in in order to help keep track of the instructions about where and how to use these. Pharmacies typically print the medication instructions only on the boxes and not directly on the medication tubes.   If your medication is too expensive, please contact our office at [phone] option 4 or send us a message through MyChart.   We are unable to tell what your co-pay for medications will be in advance as this is different depending on your insurance coverage. However, we may be able to find a substitute medication at lower cost or fill out paperwork to get insurance to cover a needed medication.   If a prior authorization is required to get your medication covered by your insurance company, please allow us 1-2 business days to complete this process.  Drug prices often vary depending on where the prescription is filled and some pharmacies may offer cheaper prices.  The website www.goodrx.com contains coupons for medications through different pharmacies. The prices here do not account for what the cost may be with help from insurance (it may be cheaper with your insurance), but the website can give you the price if you did not use any insurance.  - You can print the associated coupon and take it with your prescription to the pharmacy.  - You may also stop by our office during regular business hours and pick up a GoodRx coupon card.  - If you need your prescription sent electronically to a different pharmacy, notify our office  through [facility] MyChart or by phone at [phone] option 4.     Si Usted Necesita Algo Despus de Su Visita  Tambin puede enviarnos un mensaje a travs de MyChart. Por lo general respondemos a los mensajes de MyChart en el transcurso de 1 a 2 das hbiles.  Para renovar recetas, por favor pida a su farmacia que se ponga en contacto con nuestra oficina. Nuestro nmero de fax es el [phone].  Si tiene un asunto urgente cuando la clnica est cerrada y que no puede esperar hasta el siguiente da hbil, puede llamar/localizar a su doctor(a) al nmero que aparece a continuacin.   Por favor, tenga en cuenta que aunque hacemos todo lo posible para estar disponibles para asuntos urgentes fuera del horario de oficina, no estamos disponibles las 24 horas del da, los 7 das de la semana.   Si tiene un problema urgente y no puede   comunicarse con nosotros, puede optar por buscar atencin mdica  en el consultorio de su doctor(a), en una clnica privada, en un centro de atencin urgente o en una sala de emergencias.  Si tiene una emergencia mdica, por favor llame inmediatamente al 911 o vaya a la sala de emergencias.  Nmeros de bper  - Dr. Kowalski: [phone]  - Dra. Moye: [phone]  - Dra. Stewart: [phone]  En caso de inclemencias del tiempo, por favor llame a nuestra lnea principal al [phone] para una actualizacin sobre el estado de cualquier retraso o cierre.  Consejos para la medicacin en dermatologa: Por favor, guarde las cajas en las que vienen los medicamentos de uso tpico para ayudarle a seguir las instrucciones sobre dnde y cmo usarlos. Las farmacias generalmente imprimen las instrucciones del medicamento slo en las cajas y no directamente en los tubos del medicamento.   Si su medicamento es muy caro, por favor, pngase en contacto con nuestra oficina llamando al [phone] y presione la opcin 4 o envenos un mensaje a travs de MyChart.   No  podemos decirle cul ser su copago por los medicamentos por adelantado ya que esto es diferente dependiendo de la cobertura de su seguro. Sin embargo, es posible que podamos encontrar un medicamento sustituto a menor costo o llenar un formulario para que el seguro cubra el medicamento que se considera necesario.   Si se requiere una autorizacin previa para que su compaa de seguros cubra su medicamento, por favor permtanos de 1 a 2 das hbiles para completar este proceso.  Los precios de los medicamentos varan con frecuencia dependiendo del lugar de dnde se surte la receta y alguna farmacias pueden ofrecer precios ms baratos.  El sitio web www.goodrx.com tiene cupones para medicamentos de diferentes farmacias. Los precios aqu no tienen en cuenta lo que podra costar con la ayuda del seguro (puede ser ms barato con su seguro), pero el sitio web puede darle el precio si no utiliz ningn seguro.  - Puede imprimir el cupn correspondiente y llevarlo con su receta a la farmacia.  - Tambin puede pasar por nuestra oficina durante el horario de atencin regular y recoger una tarjeta de cupones de GoodRx.  - Si necesita que su receta se enve electrnicamente a una farmacia diferente, informe a nuestra oficina a travs de MyChart de [facility] o por telfono llamando al [phone] y presione la opcin 4.  

## 2022-12-27 NOTE — Progress Notes (Unsigned)
Follow-Up Visit   Subjective  Christopher [name] is a 77 y.o. male who presents for the following: Skin Cancer Screening and Full Body Skin Exam. Hx of BCC, Hx of SCC. Hx of Aks  Area on right ear he picks at. Raised, rough.  The patient presents for Total-Body Skin Exam (TBSE) for skin cancer screening and mole check. The patient has spots, moles and lesions to be evaluated, some may be new or changing and the patient may have concern these could be cancer.  The following portions of the chart were reviewed this encounter and updated as appropriate: medications, allergies, medical history  Review of Systems:  No other skin or systemic complaints except as noted in HPI or Assessment and Plan.  Objective  Well appearing patient in no apparent distress; mood and affect are within normal limits.  A full examination was performed including scalp, head, eyes, ears, nose, lips, neck, chest, axillae, abdomen, back, buttocks, bilateral upper extremities, bilateral lower extremities, hands, feet, fingers, toes, fingernails, and toenails. All findings within normal limits unless otherwise noted below.   Relevant physical exam findings are noted in the Assessment and Plan.  Right Ear x1, left forehead x1 (2) Erythematous thin papules/macules with gritty scale.    Assessment & Plan   SKIN CANCER SCREENING PERFORMED TODAY.  LENTIGINES, SEBORRHEIC KERATOSES, HEMANGIOMAS - Benign normal skin lesions - Benign-appearing - Call for any changes  MELANOCYTIC NEVI - Tan-brown and/or pink-flesh-colored symmetric macules and papules - Benign appearing on exam today - Observation - Call clinic for new or changing moles - Recommend daily use of broad spectrum spf 30+ sunscreen to sun-exposed areas.   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  - Call if any new or changing lesions are noted between office visits  AK (actinic keratosis) (2) Right Ear x1, left forehead x1  Actinic keratoses are precancerous spots that appear secondary to cumulative UV radiation exposure/sun exposure over time. They are chronic with expected duration over 1 year. A portion of actinic keratoses will progress to squamous cell carcinoma of the skin. It is not possible to reliably predict which spots will progress to skin cancer and so treatment is recommended to prevent development of skin cancer.  Recommend daily broad spectrum sunscreen SPF 30+ to sun-exposed areas, reapply every 2 hours as needed.  Recommend staying in the shade or wearing long sleeves, sun glasses (UVA+UVB protection) and wide brim hats (4-inch brim around the entire circumference of the hat). Call for new or changing lesions.  ACTINIC DAMAGE - Chronic condition, secondary to cumulative UV/sun exposure - diffuse scaly erythematous macules with underlying dyspigmentation - Recommend daily broad spectrum sunscreen SPF 30+ to sun-exposed areas, reapply every 2 hours as needed.  - Staying in the shade or wearing long sleeves, sun glasses (UVA+UVB protection) and wide brim hats (4-inch brim around the entire circumference of the hat) are also recommended for sun protection.  -  Call for new or changing lesions.  Destruction of lesion - Right Ear x1, left forehead x1 (2) Complexity: simple   Destruction method: cryotherapy   Informed consent: discussed and consent obtained   Timeout:  patient  name, date of birth, surgical site, and procedure verified Lesion destroyed using liquid nitrogen: Yes   Region frozen until ice ball extended beyond lesion: Yes   Outcome: patient tolerated procedure well with no complications   Post-procedure details: wound care instructions given   Additional details:  Prior to procedure, discussed risks of blister formation, small wound, skin dyspigmentation, or rare scar following cryotherapy. Recommend Vaseline ointment to treated areas while healing.    Under Eye Puffiness Recommend oculaplastic surgeon Nevin Bloodgood, MD. Patient deferred referral at this time.  Return in about 1 year (around 12/27/2023) for TBSE, HxSCC, BCC.  I, Lawson Radar, CMA, am acting as scribe for Armida Sans, MD.  Documentation: I have reviewed the above documentation for accuracy and completeness, and I agree with the above.  Armida Sans, MD

## 2022-12-28 ENCOUNTER — Encounter: Payer: Self-pay | Admitting: Dermatology

## 2023-12-06 ENCOUNTER — Ambulatory Visit: Payer: Medicare Other | Admitting: Dermatology

## 2023-12-06 ENCOUNTER — Encounter: Payer: Self-pay | Admitting: Dermatology

## 2023-12-06 DIAGNOSIS — L820 Inflamed seborrheic keratosis: Secondary | ICD-10-CM

## 2023-12-06 DIAGNOSIS — L578 Other skin changes due to chronic exposure to nonionizing radiation: Secondary | ICD-10-CM

## 2023-12-06 DIAGNOSIS — Z1283 Encounter for screening for malignant neoplasm of skin: Secondary | ICD-10-CM | POA: Diagnosis not present

## 2023-12-06 DIAGNOSIS — H02849 Edema of unspecified eye, unspecified eyelid: Secondary | ICD-10-CM

## 2023-12-06 DIAGNOSIS — L570 Actinic keratosis: Secondary | ICD-10-CM

## 2023-12-06 DIAGNOSIS — L821 Other seborrheic keratosis: Secondary | ICD-10-CM

## 2023-12-06 DIAGNOSIS — Z8589 Personal history of malignant neoplasm of other organs and systems: Secondary | ICD-10-CM

## 2023-12-06 DIAGNOSIS — W908XXA Exposure to other nonionizing radiation, initial encounter: Secondary | ICD-10-CM

## 2023-12-06 DIAGNOSIS — L814 Other melanin hyperpigmentation: Secondary | ICD-10-CM

## 2023-12-06 DIAGNOSIS — Z85828 Personal history of other malignant neoplasm of skin: Secondary | ICD-10-CM

## 2023-12-06 DIAGNOSIS — D1801 Hemangioma of skin and subcutaneous tissue: Secondary | ICD-10-CM

## 2023-12-06 DIAGNOSIS — D692 Other nonthrombocytopenic purpura: Secondary | ICD-10-CM | POA: Diagnosis not present

## 2023-12-06 NOTE — Patient Instructions (Addendum)

## 2023-12-06 NOTE — Progress Notes (Signed)
 Follow-Up Visit   Subjective  Christopher [name] is a 78 y.o. male who presents for the following: Skin Cancer Screening and Full Body Skin Exam, hx of skin cancer  The patient presents for Total-Body Skin Exam (TBSE) for skin cancer screening and mole check. The patient has spots, moles and lesions to be evaluated, some may be new or changing and the patient may have concern these could be cancer.  The following portions of the chart were reviewed this encounter and updated as appropriate: medications, allergies, medical history  Review of Systems:  No other skin or systemic complaints except as noted in HPI or Assessment and Plan.  Objective  Well appearing patient in no apparent distress; mood and affect are within normal limits.  A full examination was performed including scalp, head, eyes, ears, nose, lips, neck, chest, axillae, abdomen, back, buttocks, bilateral upper extremities, bilateral lower extremities, hands, feet, fingers, toes, fingernails, and toenails. All findings within normal limits unless otherwise noted below.   Relevant physical exam findings are noted in the Assessment and Plan.  left preauricular x 1, right infraorbital-lower eyelid x 1 (2) Erythematous thin papules/macules with gritty scale.  scalp x 1 Stuck-on, waxy, tan-brown papules and plaques -- Discussed benign etiology and prognosis.   Assessment & Plan   SKIN CANCER SCREENING PERFORMED TODAY.  LENTIGINES, SEBORRHEIC KERATOSES, HEMANGIOMAS - Benign normal skin lesions - Benign-appearing - Call for any changes  MELANOCYTIC NEVI - Tan-brown and/or pink-flesh-colored symmetric macules and papules - Benign appearing on exam today - Observation - Call clinic for new or changing moles - Recommend daily use of broad spectrum spf 30+ sunscreen to sun-exposed areas.   Purpura - Chronic; persistent and recurrent.  Treatable, but not curable. 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  - Call if any new or changing lesions are noted between office visits  AK (ACTINIC KERATOSIS) (2) left preauricular x 1, right infraorbital-lower eyelid x 1 (2) ACTINIC DAMAGE - chronic, secondary to cumulative UV radiation exposure/sun exposure over time - diffuse scaly erythematous macules with underlying dyspigmentation - Recommend daily broad spectrum sunscreen SPF 30+ to sun-exposed areas, reapply every 2 hours as needed.  - Recommend staying in the shade or wearing long sleeves, sun glasses (UVA+UVB protection) and wide brim hats (4-inch brim around the entire circumference of the hat). - Call for new or changing lesions.   If area treated on the right infraorbital-lower eyelid is not gone in 6-8 weeks return to the office for recheck  Destruction of lesion - left preauricular x 1, right infraorbital-lower eyelid x 1 (2)  Complexity: simple   Destruction method: cryotherapy   Informed consent: discussed and consent obtained   Timeout:  patient name, date of birth, surgical site, and procedure verified Lesion destroyed using liquid  nitrogen: Yes   Region frozen until ice ball extended beyond lesion: Yes   Outcome: patient tolerated procedure well with no complications   Post-procedure details: wound care instructions given    INFLAMED SEBORRHEIC KERATOSIS scalp x 1 Symptomatic, irritating, patient would like treated.  Destruction of lesion - scalp x 1 Complexity: simple   Destruction method: cryotherapy   Informed consent: discussed and consent obtained   Timeout:  patient name, date of birth, surgical site, and procedure verified Lesion destroyed using liquid nitrogen: Yes   Region frozen until ice ball extended beyond lesion: Yes   Outcome: patient tolerated procedure well with no complications   Post-procedure details: wound care instructions given      Return in about 1 year (around 12/05/2024) for TBSE, hx of BCC, hx of SCC.  IFay Kirks, CMA, am acting as scribe for Alm Rhyme, MD .   Documentation: I have reviewed the above documentation for accuracy and completeness, and I agree with the above.  Alm Rhyme, MD

## 2024-03-08 ENCOUNTER — Other Ambulatory Visit: Payer: Self-pay | Admitting: Internal Medicine

## 2024-03-08 DIAGNOSIS — H539 Unspecified visual disturbance: Secondary | ICD-10-CM

## 2024-03-13 ENCOUNTER — Ambulatory Visit
Admission: RE | Admit: 2024-03-13 | Discharge: 2024-03-13 | Disposition: A | Discharge status: Home / Self Care | Source: Ambulatory Visit | Attending: Internal Medicine | Admitting: Internal Medicine

## 2024-03-13 DIAGNOSIS — H539 Unspecified visual disturbance: Secondary | ICD-10-CM | POA: Insufficient documentation

## 2024-12-18 ENCOUNTER — Ambulatory Visit: Admitting: Dermatology
